# Patient Record
Sex: Female | Born: 1945 | Race: White | Hispanic: No | Marital: Married | Smoking: Former smoker
Health system: Southern US, Community
[De-identification: ages and names within clinical notes are randomized; demographics above are authoritative.]

## PROBLEM LIST (undated history)

## (undated) DIAGNOSIS — F0390 Unspecified dementia without behavioral disturbance: Secondary | ICD-10-CM

---

## 2007-05-31 ENCOUNTER — Ambulatory Visit: Payer: Self-pay | Admitting: Gastroenterology

## 2007-08-01 ENCOUNTER — Ambulatory Visit: Payer: Self-pay | Admitting: Family Medicine

## 2007-08-31 ENCOUNTER — Ambulatory Visit: Payer: Self-pay | Admitting: Internal Medicine

## 2007-09-05 ENCOUNTER — Ambulatory Visit: Payer: Self-pay | Admitting: Unknown Physician Specialty

## 2009-10-28 ENCOUNTER — Ambulatory Visit: Payer: Self-pay | Admitting: Family Medicine

## 2011-03-04 ENCOUNTER — Ambulatory Visit: Payer: Self-pay | Admitting: Family Medicine

## 2013-04-10 ENCOUNTER — Ambulatory Visit: Payer: Self-pay | Admitting: Family Medicine

## 2015-01-24 DIAGNOSIS — H2513 Age-related nuclear cataract, bilateral: Secondary | ICD-10-CM | POA: Diagnosis not present

## 2015-09-24 ENCOUNTER — Other Ambulatory Visit: Payer: Self-pay | Admitting: Family Medicine

## 2015-09-24 DIAGNOSIS — E785 Hyperlipidemia, unspecified: Secondary | ICD-10-CM | POA: Diagnosis not present

## 2015-09-24 DIAGNOSIS — N390 Urinary tract infection, site not specified: Secondary | ICD-10-CM | POA: Diagnosis not present

## 2015-09-24 DIAGNOSIS — M8580 Other specified disorders of bone density and structure, unspecified site: Secondary | ICD-10-CM | POA: Diagnosis not present

## 2015-09-24 DIAGNOSIS — Z0000 Encounter for general adult medical examination without abnormal findings: Secondary | ICD-10-CM | POA: Diagnosis not present

## 2015-09-24 DIAGNOSIS — Z1231 Encounter for screening mammogram for malignant neoplasm of breast: Secondary | ICD-10-CM

## 2015-10-06 ENCOUNTER — Ambulatory Visit: Payer: Self-pay

## 2015-10-09 ENCOUNTER — Ambulatory Visit
Admission: RE | Admit: 2015-10-09 | Discharge: 2015-10-09 | Disposition: A | Discharge status: Home / Self Care | Payer: Commercial Managed Care - HMO | Source: Ambulatory Visit | Attending: Family Medicine | Admitting: Family Medicine

## 2015-10-09 ENCOUNTER — Other Ambulatory Visit: Payer: Self-pay | Admitting: Family Medicine

## 2015-10-09 DIAGNOSIS — Z1231 Encounter for screening mammogram for malignant neoplasm of breast: Secondary | ICD-10-CM

## 2015-10-09 DIAGNOSIS — M8580 Other specified disorders of bone density and structure, unspecified site: Secondary | ICD-10-CM | POA: Diagnosis not present

## 2015-10-10 ENCOUNTER — Ambulatory Visit: Payer: Self-pay

## 2016-04-02 DIAGNOSIS — L732 Hidradenitis suppurativa: Secondary | ICD-10-CM | POA: Diagnosis not present

## 2016-04-02 DIAGNOSIS — N61 Mastitis without abscess: Secondary | ICD-10-CM | POA: Diagnosis not present

## 2016-05-11 DIAGNOSIS — J069 Acute upper respiratory infection, unspecified: Secondary | ICD-10-CM | POA: Diagnosis not present

## 2016-05-11 DIAGNOSIS — R21 Rash and other nonspecific skin eruption: Secondary | ICD-10-CM | POA: Diagnosis not present

## 2016-05-11 DIAGNOSIS — B9789 Other viral agents as the cause of diseases classified elsewhere: Secondary | ICD-10-CM | POA: Diagnosis not present

## 2016-05-11 DIAGNOSIS — N61 Mastitis without abscess: Secondary | ICD-10-CM | POA: Diagnosis not present

## 2016-07-27 DIAGNOSIS — L02215 Cutaneous abscess of perineum: Secondary | ICD-10-CM | POA: Diagnosis not present

## 2016-07-29 DIAGNOSIS — L02215 Cutaneous abscess of perineum: Secondary | ICD-10-CM | POA: Diagnosis not present

## 2016-09-28 DIAGNOSIS — Z1211 Encounter for screening for malignant neoplasm of colon: Secondary | ICD-10-CM | POA: Diagnosis not present

## 2016-09-28 DIAGNOSIS — E785 Hyperlipidemia, unspecified: Secondary | ICD-10-CM | POA: Diagnosis not present

## 2016-09-28 DIAGNOSIS — Z0000 Encounter for general adult medical examination without abnormal findings: Secondary | ICD-10-CM | POA: Diagnosis not present

## 2016-09-28 DIAGNOSIS — D649 Anemia, unspecified: Secondary | ICD-10-CM | POA: Diagnosis not present

## 2016-09-28 DIAGNOSIS — L259 Unspecified contact dermatitis, unspecified cause: Secondary | ICD-10-CM | POA: Diagnosis not present

## 2016-09-28 DIAGNOSIS — Z23 Encounter for immunization: Secondary | ICD-10-CM | POA: Diagnosis not present

## 2016-12-10 DIAGNOSIS — R21 Rash and other nonspecific skin eruption: Secondary | ICD-10-CM | POA: Diagnosis not present

## 2017-06-16 ENCOUNTER — Ambulatory Visit
Admission: RE | Admit: 2017-06-16 | Discharge: 2017-06-16 | Disposition: A | Discharge status: Home / Self Care | Payer: Medicare HMO | Source: Ambulatory Visit | Attending: Family Medicine | Admitting: Family Medicine

## 2017-06-16 ENCOUNTER — Other Ambulatory Visit: Payer: Self-pay | Admitting: Family Medicine

## 2017-06-16 DIAGNOSIS — M79604 Pain in right leg: Secondary | ICD-10-CM

## 2017-06-16 DIAGNOSIS — R600 Localized edema: Secondary | ICD-10-CM | POA: Diagnosis not present

## 2017-06-16 DIAGNOSIS — M79661 Pain in right lower leg: Secondary | ICD-10-CM | POA: Diagnosis not present

## 2017-06-16 DIAGNOSIS — R413 Other amnesia: Secondary | ICD-10-CM | POA: Diagnosis not present

## 2019-02-08 DIAGNOSIS — L255 Unspecified contact dermatitis due to plants, except food: Secondary | ICD-10-CM | POA: Diagnosis not present

## 2019-09-05 DIAGNOSIS — E785 Hyperlipidemia, unspecified: Secondary | ICD-10-CM | POA: Diagnosis not present

## 2019-09-05 DIAGNOSIS — Z0000 Encounter for general adult medical examination without abnormal findings: Secondary | ICD-10-CM | POA: Diagnosis not present

## 2019-09-05 DIAGNOSIS — Z87891 Personal history of nicotine dependence: Secondary | ICD-10-CM | POA: Diagnosis not present

## 2019-09-07 DIAGNOSIS — Z0000 Encounter for general adult medical examination without abnormal findings: Secondary | ICD-10-CM | POA: Diagnosis not present

## 2019-09-07 DIAGNOSIS — R413 Other amnesia: Secondary | ICD-10-CM | POA: Diagnosis not present

## 2019-09-07 DIAGNOSIS — E785 Hyperlipidemia, unspecified: Secondary | ICD-10-CM | POA: Diagnosis not present

## 2019-09-10 ENCOUNTER — Other Ambulatory Visit: Payer: Self-pay | Admitting: Family Medicine

## 2019-09-10 DIAGNOSIS — Z1231 Encounter for screening mammogram for malignant neoplasm of breast: Secondary | ICD-10-CM

## 2020-02-11 DIAGNOSIS — Z20822 Contact with and (suspected) exposure to covid-19: Secondary | ICD-10-CM | POA: Diagnosis not present

## 2020-02-11 DIAGNOSIS — Z03818 Encounter for observation for suspected exposure to other biological agents ruled out: Secondary | ICD-10-CM | POA: Diagnosis not present

## 2020-02-11 DIAGNOSIS — U071 COVID-19: Secondary | ICD-10-CM | POA: Diagnosis not present

## 2020-02-14 DIAGNOSIS — U071 COVID-19: Secondary | ICD-10-CM | POA: Diagnosis not present

## 2020-02-14 DIAGNOSIS — E876 Hypokalemia: Secondary | ICD-10-CM | POA: Diagnosis not present

## 2020-02-14 DIAGNOSIS — R05 Cough: Secondary | ICD-10-CM | POA: Diagnosis not present

## 2020-02-14 DIAGNOSIS — J9601 Acute respiratory failure with hypoxia: Secondary | ICD-10-CM | POA: Diagnosis not present

## 2020-02-14 DIAGNOSIS — Z87891 Personal history of nicotine dependence: Secondary | ICD-10-CM | POA: Diagnosis not present

## 2020-02-14 DIAGNOSIS — R0602 Shortness of breath: Secondary | ICD-10-CM | POA: Diagnosis not present

## 2020-02-14 DIAGNOSIS — F0390 Unspecified dementia without behavioral disturbance: Secondary | ICD-10-CM | POA: Diagnosis not present

## 2020-02-14 DIAGNOSIS — J1282 Pneumonia due to coronavirus disease 2019: Secondary | ICD-10-CM | POA: Diagnosis not present

## 2020-02-14 DIAGNOSIS — R918 Other nonspecific abnormal finding of lung field: Secondary | ICD-10-CM | POA: Diagnosis not present

## 2020-02-14 DIAGNOSIS — R0902 Hypoxemia: Secondary | ICD-10-CM | POA: Diagnosis not present

## 2020-02-14 DIAGNOSIS — D6859 Other primary thrombophilia: Secondary | ICD-10-CM | POA: Diagnosis not present

## 2020-02-22 DIAGNOSIS — Z87891 Personal history of nicotine dependence: Secondary | ICD-10-CM | POA: Diagnosis not present

## 2020-02-22 DIAGNOSIS — G4700 Insomnia, unspecified: Secondary | ICD-10-CM | POA: Diagnosis not present

## 2020-02-22 DIAGNOSIS — J9601 Acute respiratory failure with hypoxia: Secondary | ICD-10-CM | POA: Diagnosis not present

## 2020-02-22 DIAGNOSIS — E8809 Other disorders of plasma-protein metabolism, not elsewhere classified: Secondary | ICD-10-CM | POA: Diagnosis not present

## 2020-02-22 DIAGNOSIS — U071 COVID-19: Secondary | ICD-10-CM | POA: Diagnosis not present

## 2020-02-22 DIAGNOSIS — J1282 Pneumonia due to coronavirus disease 2019: Secondary | ICD-10-CM | POA: Diagnosis not present

## 2020-02-22 DIAGNOSIS — E876 Hypokalemia: Secondary | ICD-10-CM | POA: Diagnosis not present

## 2020-02-22 DIAGNOSIS — F0390 Unspecified dementia without behavioral disturbance: Secondary | ICD-10-CM | POA: Diagnosis not present

## 2020-02-22 DIAGNOSIS — Z9981 Dependence on supplemental oxygen: Secondary | ICD-10-CM | POA: Diagnosis not present

## 2020-02-22 DIAGNOSIS — Z9181 History of falling: Secondary | ICD-10-CM | POA: Diagnosis not present

## 2020-02-23 DIAGNOSIS — J9601 Acute respiratory failure with hypoxia: Secondary | ICD-10-CM | POA: Diagnosis not present

## 2020-02-23 DIAGNOSIS — Z9981 Dependence on supplemental oxygen: Secondary | ICD-10-CM | POA: Diagnosis not present

## 2020-02-23 DIAGNOSIS — E876 Hypokalemia: Secondary | ICD-10-CM | POA: Diagnosis not present

## 2020-02-23 DIAGNOSIS — Z87891 Personal history of nicotine dependence: Secondary | ICD-10-CM | POA: Diagnosis not present

## 2020-02-23 DIAGNOSIS — F0390 Unspecified dementia without behavioral disturbance: Secondary | ICD-10-CM | POA: Diagnosis not present

## 2020-02-23 DIAGNOSIS — E8809 Other disorders of plasma-protein metabolism, not elsewhere classified: Secondary | ICD-10-CM | POA: Diagnosis not present

## 2020-02-23 DIAGNOSIS — Z9181 History of falling: Secondary | ICD-10-CM | POA: Diagnosis not present

## 2020-02-23 DIAGNOSIS — U071 COVID-19: Secondary | ICD-10-CM | POA: Diagnosis not present

## 2020-02-23 DIAGNOSIS — J1282 Pneumonia due to coronavirus disease 2019: Secondary | ICD-10-CM | POA: Diagnosis not present

## 2020-02-25 DIAGNOSIS — Z9981 Dependence on supplemental oxygen: Secondary | ICD-10-CM | POA: Diagnosis not present

## 2020-02-25 DIAGNOSIS — U071 COVID-19: Secondary | ICD-10-CM | POA: Diagnosis not present

## 2020-02-25 DIAGNOSIS — J1282 Pneumonia due to coronavirus disease 2019: Secondary | ICD-10-CM | POA: Diagnosis not present

## 2020-02-25 DIAGNOSIS — Z9181 History of falling: Secondary | ICD-10-CM | POA: Diagnosis not present

## 2020-02-25 DIAGNOSIS — F0390 Unspecified dementia without behavioral disturbance: Secondary | ICD-10-CM | POA: Diagnosis not present

## 2020-02-25 DIAGNOSIS — E876 Hypokalemia: Secondary | ICD-10-CM | POA: Diagnosis not present

## 2020-02-25 DIAGNOSIS — Z87891 Personal history of nicotine dependence: Secondary | ICD-10-CM | POA: Diagnosis not present

## 2020-02-25 DIAGNOSIS — E8809 Other disorders of plasma-protein metabolism, not elsewhere classified: Secondary | ICD-10-CM | POA: Diagnosis not present

## 2020-02-25 DIAGNOSIS — J9601 Acute respiratory failure with hypoxia: Secondary | ICD-10-CM | POA: Diagnosis not present

## 2020-02-27 DIAGNOSIS — U071 COVID-19: Secondary | ICD-10-CM | POA: Diagnosis not present

## 2020-02-27 DIAGNOSIS — J1282 Pneumonia due to coronavirus disease 2019: Secondary | ICD-10-CM | POA: Diagnosis not present

## 2020-02-27 DIAGNOSIS — Z87891 Personal history of nicotine dependence: Secondary | ICD-10-CM | POA: Diagnosis not present

## 2020-02-27 DIAGNOSIS — J9601 Acute respiratory failure with hypoxia: Secondary | ICD-10-CM | POA: Diagnosis not present

## 2020-02-27 DIAGNOSIS — Z9181 History of falling: Secondary | ICD-10-CM | POA: Diagnosis not present

## 2020-02-27 DIAGNOSIS — E876 Hypokalemia: Secondary | ICD-10-CM | POA: Diagnosis not present

## 2020-02-27 DIAGNOSIS — Z9981 Dependence on supplemental oxygen: Secondary | ICD-10-CM | POA: Diagnosis not present

## 2020-02-27 DIAGNOSIS — F0390 Unspecified dementia without behavioral disturbance: Secondary | ICD-10-CM | POA: Diagnosis not present

## 2020-02-27 DIAGNOSIS — E8809 Other disorders of plasma-protein metabolism, not elsewhere classified: Secondary | ICD-10-CM | POA: Diagnosis not present

## 2020-02-28 DIAGNOSIS — Z87891 Personal history of nicotine dependence: Secondary | ICD-10-CM | POA: Diagnosis not present

## 2020-02-28 DIAGNOSIS — F0390 Unspecified dementia without behavioral disturbance: Secondary | ICD-10-CM | POA: Diagnosis not present

## 2020-02-28 DIAGNOSIS — E8809 Other disorders of plasma-protein metabolism, not elsewhere classified: Secondary | ICD-10-CM | POA: Diagnosis not present

## 2020-02-28 DIAGNOSIS — Z9181 History of falling: Secondary | ICD-10-CM | POA: Diagnosis not present

## 2020-02-28 DIAGNOSIS — Z9981 Dependence on supplemental oxygen: Secondary | ICD-10-CM | POA: Diagnosis not present

## 2020-02-28 DIAGNOSIS — J1282 Pneumonia due to coronavirus disease 2019: Secondary | ICD-10-CM | POA: Diagnosis not present

## 2020-02-28 DIAGNOSIS — U071 COVID-19: Secondary | ICD-10-CM | POA: Diagnosis not present

## 2020-02-28 DIAGNOSIS — J9601 Acute respiratory failure with hypoxia: Secondary | ICD-10-CM | POA: Diagnosis not present

## 2020-02-28 DIAGNOSIS — E876 Hypokalemia: Secondary | ICD-10-CM | POA: Diagnosis not present

## 2020-02-29 DIAGNOSIS — J9601 Acute respiratory failure with hypoxia: Secondary | ICD-10-CM | POA: Diagnosis not present

## 2020-02-29 DIAGNOSIS — E8809 Other disorders of plasma-protein metabolism, not elsewhere classified: Secondary | ICD-10-CM | POA: Diagnosis not present

## 2020-02-29 DIAGNOSIS — Z87891 Personal history of nicotine dependence: Secondary | ICD-10-CM | POA: Diagnosis not present

## 2020-02-29 DIAGNOSIS — U071 COVID-19: Secondary | ICD-10-CM | POA: Diagnosis not present

## 2020-02-29 DIAGNOSIS — F0390 Unspecified dementia without behavioral disturbance: Secondary | ICD-10-CM | POA: Diagnosis not present

## 2020-02-29 DIAGNOSIS — Z9181 History of falling: Secondary | ICD-10-CM | POA: Diagnosis not present

## 2020-02-29 DIAGNOSIS — J1282 Pneumonia due to coronavirus disease 2019: Secondary | ICD-10-CM | POA: Diagnosis not present

## 2020-02-29 DIAGNOSIS — Z9981 Dependence on supplemental oxygen: Secondary | ICD-10-CM | POA: Diagnosis not present

## 2020-02-29 DIAGNOSIS — E876 Hypokalemia: Secondary | ICD-10-CM | POA: Diagnosis not present

## 2020-03-03 DIAGNOSIS — G4700 Insomnia, unspecified: Secondary | ICD-10-CM | POA: Diagnosis not present

## 2020-03-03 DIAGNOSIS — U071 COVID-19: Secondary | ICD-10-CM | POA: Diagnosis not present

## 2020-03-03 DIAGNOSIS — R413 Other amnesia: Secondary | ICD-10-CM | POA: Diagnosis not present

## 2020-03-05 DIAGNOSIS — Z9981 Dependence on supplemental oxygen: Secondary | ICD-10-CM | POA: Diagnosis not present

## 2020-03-05 DIAGNOSIS — F0390 Unspecified dementia without behavioral disturbance: Secondary | ICD-10-CM | POA: Diagnosis not present

## 2020-03-05 DIAGNOSIS — J9601 Acute respiratory failure with hypoxia: Secondary | ICD-10-CM | POA: Diagnosis not present

## 2020-03-05 DIAGNOSIS — Z9181 History of falling: Secondary | ICD-10-CM | POA: Diagnosis not present

## 2020-03-05 DIAGNOSIS — U071 COVID-19: Secondary | ICD-10-CM | POA: Diagnosis not present

## 2020-03-05 DIAGNOSIS — E8809 Other disorders of plasma-protein metabolism, not elsewhere classified: Secondary | ICD-10-CM | POA: Diagnosis not present

## 2020-03-05 DIAGNOSIS — J1282 Pneumonia due to coronavirus disease 2019: Secondary | ICD-10-CM | POA: Diagnosis not present

## 2020-03-05 DIAGNOSIS — E876 Hypokalemia: Secondary | ICD-10-CM | POA: Diagnosis not present

## 2020-03-05 DIAGNOSIS — Z87891 Personal history of nicotine dependence: Secondary | ICD-10-CM | POA: Diagnosis not present

## 2020-03-11 DIAGNOSIS — Z87891 Personal history of nicotine dependence: Secondary | ICD-10-CM | POA: Diagnosis not present

## 2020-03-11 DIAGNOSIS — Z9981 Dependence on supplemental oxygen: Secondary | ICD-10-CM | POA: Diagnosis not present

## 2020-03-11 DIAGNOSIS — Z9181 History of falling: Secondary | ICD-10-CM | POA: Diagnosis not present

## 2020-03-11 DIAGNOSIS — E876 Hypokalemia: Secondary | ICD-10-CM | POA: Diagnosis not present

## 2020-03-11 DIAGNOSIS — J1282 Pneumonia due to coronavirus disease 2019: Secondary | ICD-10-CM | POA: Diagnosis not present

## 2020-03-11 DIAGNOSIS — F0390 Unspecified dementia without behavioral disturbance: Secondary | ICD-10-CM | POA: Diagnosis not present

## 2020-03-11 DIAGNOSIS — E8809 Other disorders of plasma-protein metabolism, not elsewhere classified: Secondary | ICD-10-CM | POA: Diagnosis not present

## 2020-03-11 DIAGNOSIS — U071 COVID-19: Secondary | ICD-10-CM | POA: Diagnosis not present

## 2020-03-11 DIAGNOSIS — J9601 Acute respiratory failure with hypoxia: Secondary | ICD-10-CM | POA: Diagnosis not present

## 2020-03-13 DIAGNOSIS — Z9181 History of falling: Secondary | ICD-10-CM | POA: Diagnosis not present

## 2020-03-13 DIAGNOSIS — E876 Hypokalemia: Secondary | ICD-10-CM | POA: Diagnosis not present

## 2020-03-13 DIAGNOSIS — E8809 Other disorders of plasma-protein metabolism, not elsewhere classified: Secondary | ICD-10-CM | POA: Diagnosis not present

## 2020-03-13 DIAGNOSIS — Z87891 Personal history of nicotine dependence: Secondary | ICD-10-CM | POA: Diagnosis not present

## 2020-03-13 DIAGNOSIS — U071 COVID-19: Secondary | ICD-10-CM | POA: Diagnosis not present

## 2020-03-13 DIAGNOSIS — J1282 Pneumonia due to coronavirus disease 2019: Secondary | ICD-10-CM | POA: Diagnosis not present

## 2020-03-13 DIAGNOSIS — Z9981 Dependence on supplemental oxygen: Secondary | ICD-10-CM | POA: Diagnosis not present

## 2020-03-13 DIAGNOSIS — F0390 Unspecified dementia without behavioral disturbance: Secondary | ICD-10-CM | POA: Diagnosis not present

## 2020-03-13 DIAGNOSIS — J9601 Acute respiratory failure with hypoxia: Secondary | ICD-10-CM | POA: Diagnosis not present

## 2020-03-14 DIAGNOSIS — U071 COVID-19: Secondary | ICD-10-CM | POA: Diagnosis not present

## 2020-03-14 DIAGNOSIS — J9601 Acute respiratory failure with hypoxia: Secondary | ICD-10-CM | POA: Diagnosis not present

## 2020-04-03 DIAGNOSIS — G8929 Other chronic pain: Secondary | ICD-10-CM | POA: Diagnosis not present

## 2020-04-03 DIAGNOSIS — M25561 Pain in right knee: Secondary | ICD-10-CM | POA: Diagnosis not present

## 2020-04-30 ENCOUNTER — Other Ambulatory Visit: Payer: Self-pay | Admitting: Neurology

## 2020-04-30 DIAGNOSIS — F0150 Vascular dementia without behavioral disturbance: Secondary | ICD-10-CM

## 2020-05-14 ENCOUNTER — Ambulatory Visit
Admission: RE | Admit: 2020-05-14 | Discharge: 2020-05-14 | Disposition: A | Discharge status: Home / Self Care | Payer: Medicare HMO | Source: Ambulatory Visit | Attending: Neurology | Admitting: Neurology

## 2020-05-14 ENCOUNTER — Other Ambulatory Visit: Payer: Self-pay

## 2020-05-14 DIAGNOSIS — F0280 Dementia in other diseases classified elsewhere without behavioral disturbance: Secondary | ICD-10-CM | POA: Diagnosis present

## 2020-05-14 DIAGNOSIS — F0150 Vascular dementia without behavioral disturbance: Secondary | ICD-10-CM | POA: Insufficient documentation

## 2020-05-14 DIAGNOSIS — G309 Alzheimer's disease, unspecified: Secondary | ICD-10-CM | POA: Diagnosis present

## 2020-05-14 MED ORDER — GADOBUTROL 1 MMOL/ML IV SOLN
7.0000 mL | Freq: Once | INTRAVENOUS | Status: AC | PRN
  Administered 2020-05-14: 7 mL via INTRAVENOUS

## 2020-09-17 ENCOUNTER — Other Ambulatory Visit: Payer: Self-pay | Admitting: Family Medicine

## 2020-09-17 DIAGNOSIS — Z1231 Encounter for screening mammogram for malignant neoplasm of breast: Secondary | ICD-10-CM

## 2020-10-10 ENCOUNTER — Other Ambulatory Visit: Payer: Self-pay

## 2020-10-10 ENCOUNTER — Ambulatory Visit
Admission: RE | Admit: 2020-10-10 | Discharge: 2020-10-10 | Disposition: A | Discharge status: Home / Self Care | Payer: Medicare HMO | Source: Ambulatory Visit | Attending: Family Medicine | Admitting: Family Medicine

## 2020-10-10 DIAGNOSIS — Z1231 Encounter for screening mammogram for malignant neoplasm of breast: Secondary | ICD-10-CM

## 2021-12-11 ENCOUNTER — Ambulatory Visit
Admission: EM | Admit: 2021-12-11 | Discharge: 2021-12-11 | Disposition: A | Discharge status: Home / Self Care | Payer: Medicare HMO

## 2021-12-11 ENCOUNTER — Encounter: Payer: Self-pay | Admitting: Emergency Medicine

## 2021-12-11 DIAGNOSIS — S91012A Laceration without foreign body, left ankle, initial encounter: Secondary | ICD-10-CM

## 2021-12-11 DIAGNOSIS — Z23 Encounter for immunization: Secondary | ICD-10-CM

## 2021-12-11 HISTORY — DX: Unspecified dementia, unspecified severity, without behavioral disturbance, psychotic disturbance, mood disturbance, and anxiety: F03.90

## 2021-12-11 MED ORDER — CEPHALEXIN 500 MG PO CAPS: 500.0000 mg | ORAL_CAPSULE | Freq: Two times a day (BID) | ORAL | 0 refills | Status: AC

## 2021-12-11 MED ORDER — TETANUS-DIPHTHERIA TOXOIDS TD 5-2 LFU IM INJ
0.5000 mL | INJECTION | Freq: Once | INTRAMUSCULAR | Status: AC
  Administered 2021-12-11: 0.5 mL via INTRAMUSCULAR

## 2021-12-11 NOTE — Discharge Instructions (Addendum)
Leave the dressing in place for the next 24 hours.  After 24 hours remove the dressing, wash the wound with warm water and soap, pat it dry, and apply a thin smear of bacitracin.  Clean the wound daily and apply bacitracin for the first 2 days.  After that a scab should have started to form and you can leave the wound open to air when you are at home and cover with a Band-Aid when you go out in public.  Take the Keflex twice daily for 7 days for prevention of wound infection.  Sutures remain in place for 10 days, please return here or see your primary care provider for removal.  If you develop any redness at the wound site, swelling, pain, drainage, red streaks going up your hand, or fever please return for reevaluation.

## 2021-12-11 NOTE — ED Provider Notes (Signed)
[facility]    CSN: [id number] Arrival date & time: 12/11/21  1411      History   Chief Complaint Chief Complaint  Patient presents with   Laceration   Puncture Wound    Left ankle     HPI Rachel Dominguez is a 76 y.o. female.   HPI  76 year old female here for evaluation of laceration.  Patient is here with her daughter for evaluation of a laceration that she sustained last night around 6 PM on the inner aspect of her left ankle near her Achilles.  She has a 2.5 cm triangular laceration with some venous oozing.  She denies any numbness or tingling.  She is unsure of when her last tetanus shot was.  Past Medical History:  Diagnosis Date   Dementia ([place])     There are no problems to display for this patient.   History reviewed. No pertinent surgical history.  OB History   No obstetric history on file.      Home Medications    Prior to Admission medications   Medication Sig Start Date End Date Taking? Authorizing Provider  cephALEXin (KEFLEX) 500 MG capsule Take 1 capsule (500 mg total) by mouth 2 (two) times daily for 7 days. 12/11/21 12/18/21 Yes Margarette Canada, NP  donepezil (ARICEPT) 10 MG tablet TAKE 1 TABLET BY MOUTH EVERY DAY AT NIGHT 03/02/21  Yes [provider]  meloxicam (MOBIC) 7.5 MG tablet Take 7.5 mg by mouth daily as needed. 10/15/21  Yes [provider]  traZODone (DESYREL) 150 MG tablet Take by mouth. 10/07/21  Yes [provider]    Family History Family History  Problem Relation Age of Onset   Breast cancer Neg Hx     Social History Social History   Tobacco Use   Smoking status: Former    Types: Cigarettes   Smokeless tobacco: Former  Scientific laboratory technician Use: Never used  Substance Use Topics   Alcohol use: Not Currently   Drug use: Never     Allergies   Patient has no known allergies.   Review of Systems Review of Systems  Skin:  Positive for color change and wound.  Neurological:  Negative  for weakness.  Hematological: Negative.   Psychiatric/Behavioral: Negative.       Physical Exam Triage Vital Signs ED Triage Vitals  Enc Vitals Group     BP 12/11/21 1443 (!) 178/64     Pulse Rate 12/11/21 1443 76     Resp 12/11/21 1443 14     Temp 12/11/21 1443 98.2 F (36.8 C)     Temp Source 12/11/21 1443 Oral     SpO2 12/11/21 1443 95 %     Weight 12/11/21 1440 149 lb 0.5 oz (67.6 kg)     Height 12/11/21 1440 5\' 5"  (1.651 m)     Head Circumference --      Peak Flow --      Pain Score 12/11/21 1439 0     Pain Loc --      Pain Edu? --      Excl. in [place]? --    No data found.  Updated Vital Signs BP (!) 178/64 (BP Location: Right Arm)   Pulse 76   Temp 98.2 F (36.8 C) (Oral)   Resp 14   Ht 5\' 5"  (1.651 m)   Wt 149 lb 0.5 oz (67.6 kg)   SpO2 95%   BMI 24.80 kg/m   Visual Acuity Right  Eye Distance:   Left Eye Distance:   Bilateral Distance:    Right Eye Near:   Left Eye Near:    Bilateral Near:     Physical Exam Vitals and nursing note reviewed.  Constitutional:      Appearance: Normal appearance. She is not ill-appearing.  HENT:     Head: Normocephalic and atraumatic.  Musculoskeletal:        General: Signs of injury present.  Skin:    General: Skin is warm and dry.     Capillary Refill: Capillary refill takes less than 2 seconds.     Findings: Bruising present.  Neurological:     General: No focal deficit present.     Mental Status: She is alert and oriented to person, place, and time.  Psychiatric:        Mood and Affect: Mood normal.        Behavior: Behavior normal.        Thought Content: Thought content normal.        Judgment: Judgment normal.      UC Treatments / Results  Labs (all labs ordered are listed, but only abnormal results are displayed) Labs Reviewed - No data to display  EKG   Radiology No results found.  Procedures Laceration Repair  Date/Time: 12/11/2021 3:17 PM  Performed by: Becky Augusta, NP Authorized by:  Becky Augusta, NP   Consent:    Consent obtained:  Verbal   Consent given by:  Patient   Risks discussed:  Infection, pain, retained foreign body, poor cosmetic result and poor wound healing   Alternatives discussed:  Referral Universal protocol:    Patient identity confirmed:  Verbally with patient Anesthesia:    Anesthesia method:  Local infiltration   Local anesthetic:  Lidocaine 1% w/o epi (3 mL) Laceration details:    Location:  Foot   Foot location:  L ankle   Length (cm):  2.5   Depth (mm):  6 Pre-procedure details:    Preparation:  Patient was prepped and draped in usual sterile fashion Exploration:    Limited defect created (wound extended): no     Hemostasis achieved with:  Direct pressure   Wound exploration: wound explored through full range of motion and entire depth of wound visualized     Wound extent: no areolar tissue violation noted, no fascia violation noted, no foreign bodies/material noted, no muscle damage noted, no nerve damage noted and no tendon damage noted     Contaminated: no   Treatment:    Area cleansed with:  Chlorhexidine and saline   Amount of cleaning:  Standard   Irrigation solution:  Sterile saline   Irrigation volume:  120   Irrigation method:  Pressure wash and syringe   Debridement:  None Skin repair:    Repair method:  Sutures   Suture size:  4-0   Suture material:  Prolene   Suture technique:  Simple interrupted   Number of sutures:  5 Approximation:    Approximation:  Close Repair type:    Repair type:  Simple Post-procedure details:    Dressing:  Non-adherent dressing   Procedure completion:  Tolerated well, no immediate complications  (including critical care time)  Medications Ordered in UC Medications  tetanus & diphtheria toxoids (adult) (TENIVAC) injection 0.5 mL (0.5 mLs Intramuscular Given 12/11/21 1453)    Initial Impression / Assessment and Plan / UC Course  I have reviewed the triage vital signs and the nursing  notes.  Pertinent labs &  imaging results that were available during my care of the patient were reviewed by me and considered in my medical decision making (see chart for details).  Patient very pleasant, nontoxic-appearing 62 old female here for evaluation of a laceration that is on the medial and posterior aspect of her left ankle.  The laceration was sustained when her foot got caught on the storm door when going into her house last night around 6 PM.  She had a dressing in place and her daughter brought her in for evaluation.  She is still having some venous oozing.  The laceration itself is triangular in appearance and each leg of the triangle measures approximately 2.5 cm.  There is some faint venous oozing from the wound.  I will suture the skin flap in place and discharge patient home on Keflex twice daily x7 days.  I will also have her tetanus shot updated.  Patient's wound was closed using 5 simple erupted sutures of 4-0 Prolene.  Patient tolerated procedure well.  Discharged home on Keflex.  Return precautions reviewed.   Final Clinical Impressions(s) / UC Diagnoses   Final diagnoses:  Laceration of left ankle, initial encounter     Discharge Instructions      Leave the dressing in place for the next 24 hours.  After 24 hours remove the dressing, wash the wound with warm water and soap, pat it dry, and apply a thin smear of bacitracin.  Clean the wound daily and apply bacitracin for the first 2 days.  After that a scab should have started to form and you can leave the wound open to air when you are at home and cover with a Band-Aid when you go out in public.  Take the Keflex twice daily for 7 days for prevention of wound infection.  Sutures remain in place for 10 days, please return here or see your primary care provider for removal.  If you develop any redness at the wound site, swelling, pain, drainage, red streaks going up your hand, or fever please return for reevaluation.       ED Prescriptions     Medication Sig Dispense Auth. Provider   cephALEXin (KEFLEX) 500 MG capsule Take 1 capsule (500 mg total) by mouth 2 (two) times daily for 7 days. 14 capsule Becky Augusta, NP      PDMP not reviewed this encounter.   Becky Augusta, NP 12/11/21 1520

## 2021-12-11 NOTE — ED Triage Notes (Signed)
Patient states that she cut her left ankle on the storm door around 6pm yesterday.  Patient has a deep laceration to her left ankle.  Patient has minimal bleeding at this time.

## 2021-12-21 ENCOUNTER — Ambulatory Visit
Admission: EM | Admit: 2021-12-21 | Discharge: 2021-12-21 | Disposition: A | Discharge status: Home / Self Care | Payer: Medicare HMO | Attending: Nurse Practitioner | Admitting: Nurse Practitioner

## 2021-12-21 ENCOUNTER — Encounter: Payer: Self-pay | Admitting: Emergency Medicine

## 2021-12-21 ENCOUNTER — Other Ambulatory Visit: Payer: Self-pay

## 2021-12-21 DIAGNOSIS — Z4802 Encounter for removal of sutures: Secondary | ICD-10-CM

## 2021-12-21 DIAGNOSIS — S91012D Laceration without foreign body, left ankle, subsequent encounter: Secondary | ICD-10-CM

## 2021-12-21 DIAGNOSIS — Z5189 Encounter for other specified aftercare: Secondary | ICD-10-CM

## 2021-12-21 MED ORDER — DOXYCYCLINE HYCLATE 100 MG PO CAPS: 100.0000 mg | ORAL_CAPSULE | Freq: Two times a day (BID) | ORAL | 0 refills | Status: AC

## 2021-12-21 NOTE — ED Triage Notes (Addendum)
Patient was seen 12/11/2021 for a laceration to left inner ankle.  Patient is here today for suture removal.  Crusty at edge of wound, redness around wound. Requested crystal, np  see patient

## 2021-12-21 NOTE — ED Provider Notes (Signed)
  [facility]    CSN: [id number] Arrival date & time: 12/21/21  1306      History   Chief Complaint No chief complaint on file.   HPI Rachel Dominguez is a 76 y.o. female.   HPI  Past Medical History:  Diagnosis Date  . Dementia ([facility])     There are no problems to display for this patient.   No past surgical history on file.  OB History   No obstetric history on file.      Home Medications    Prior to Admission medications   Medication Sig Start Date End Date Taking? Authorizing Provider  donepezil (ARICEPT) 10 MG tablet TAKE 1 TABLET BY MOUTH EVERY DAY AT NIGHT 03/02/21   [provider]  meloxicam (MOBIC) 7.5 MG tablet Take 7.5 mg by mouth daily as needed. 10/15/21   [provider]  traZODone (DESYREL) 150 MG tablet Take by mouth. 10/07/21   [provider]    Family History Family History  Problem Relation Age of Onset  . Breast cancer Neg Hx     Social History Social History   Tobacco Use  . Smoking status: Former    Types: Cigarettes  . Smokeless tobacco: Former  Advertising account planner  . Vaping Use: Never used  Substance Use Topics  . Alcohol use: Not Currently  . Drug use: Never     Allergies   Patient has no known allergies.   Review of Systems Review of Systems   Physical Exam Triage Vital Signs ED Triage Vitals  Enc Vitals Group     BP      Pulse      Resp      Temp      Temp src      SpO2      Weight      Height      Head Circumference      Peak Flow      Pain Score      Pain Loc      Pain Edu?      Excl. in [place]?    No data found.  Updated Vital Signs There were no vitals taken for this visit.  Visual Acuity Right Eye Distance:   Left Eye Distance:   Bilateral Distance:    Right Eye Near:   Left Eye Near:    Bilateral Near:     Physical Exam   UC Treatments / Results  Labs (all labs ordered are listed, but only abnormal results are displayed) Labs Reviewed - No data to  display  EKG   Radiology No results found.  Procedures Procedures (including critical care time)  Medications Ordered in UC Medications - No data to display  Initial Impression / Assessment and Plan / UC Course  I have reviewed the triage vital signs and the nursing notes.  Pertinent labs & imaging results that were available during my care of the patient were reviewed by me and considered in my medical decision making (see chart for details).     *** Final Clinical Impressions(s) / UC Diagnoses   Final diagnoses:  None   Discharge Instructions   None    ED Prescriptions   None    PDMP not reviewed this encounter.

## 2021-12-21 NOTE — Discharge Instructions (Addendum)
Doxycycline 100 mg  BID take with food Take as directed Please complete the entire prescription.  May use band-aid to cover at night May use the neosporin daily as needed

## 2021-12-28 ENCOUNTER — Ambulatory Visit
Admission: EM | Admit: 2021-12-28 | Discharge: 2021-12-28 | Disposition: A | Discharge status: Home / Self Care | Payer: Medicare HMO | Attending: Physician Assistant | Admitting: Physician Assistant

## 2021-12-28 ENCOUNTER — Inpatient Hospital Stay
Admission: RE | Admit: 2021-12-28 | Discharge: 2021-12-28 | Disposition: A | Discharge status: Home / Self Care | Payer: Self-pay | Source: Ambulatory Visit

## 2021-12-28 DIAGNOSIS — S91312D Laceration without foreign body, left foot, subsequent encounter: Secondary | ICD-10-CM

## 2021-12-28 DIAGNOSIS — L03116 Cellulitis of left lower limb: Secondary | ICD-10-CM | POA: Diagnosis not present

## 2021-12-28 DIAGNOSIS — Z4802 Encounter for removal of sutures: Secondary | ICD-10-CM

## 2021-12-28 DIAGNOSIS — T148XXD Other injury of unspecified body region, subsequent encounter: Secondary | ICD-10-CM

## 2021-12-28 MED ORDER — AMOXICILLIN-POT CLAVULANATE 875-125 MG PO TABS: 1.0000 | ORAL_TABLET | Freq: Two times a day (BID) | ORAL | 0 refills | Status: AC

## 2021-12-28 NOTE — ED Triage Notes (Signed)
Pt c/o suture removal.  Pt has 5 sutures along a Triangle shaped wound on the inner left ankle. Wound is tender to touch and still red around the area.

## 2021-12-28 NOTE — ED Provider Notes (Signed)
[facility]    CSN: [id number] Arrival date & time: 12/28/21  1258      History   Chief Complaint Chief Complaint  Patient presents with   Suture / Staple Removal    HPI Rachel Dominguez is a 76 y.o. female presenting for reevaluation for wound of the left inner ankle.  Patient was initially seen on 12/11/2021 and had suture repair with 5 simple sutures.  She was then seen on 12/21/2021 to have the sutures removed but was found to have an infection and was placed on more antibiotics.  Initially after the suture repair she was placed on Keflex and then at the second visit she was placed on doxycycline.  Sutures were not removed.  Patient returns today, 17 days after suture placement, for reevaluation.  She continues to have redness in this area and tenderness.  She says the redness is gone down a little since her last visit but not much.  No pustular drainage.  No fevers.  Patient has been cleaning in the shower with soap and water and apply Neosporin.  She denies any reinjury.  HPI  Past Medical History:  Diagnosis Date   Dementia ([facility])     There are no problems to display for this patient.   History reviewed. No pertinent surgical history.  OB History   No obstetric history on file.      Home Medications    Prior to Admission medications   Medication Sig Start Date End Date Taking? Authorizing Provider  amoxicillin-clavulanate (AUGMENTIN) 875-125 MG tablet Take 1 tablet by mouth every 12 (twelve) hours for 7 days. 12/28/21 01/04/22 Yes Eusebio Friendly B, PA-C  donepezil (ARICEPT) 10 MG tablet TAKE 1 TABLET BY MOUTH EVERY DAY AT NIGHT 03/02/21  Yes [provider]  meloxicam (MOBIC) 7.5 MG tablet Take 7.5 mg by mouth daily as needed. 10/15/21  Yes [provider]  traZODone (DESYREL) 150 MG tablet Take by mouth. 10/07/21  Yes [provider]    Family History Family History  Problem Relation Age of Onset   Breast cancer Neg Hx     Social  History Social History   Tobacco Use   Smoking status: Former    Types: Cigarettes   Smokeless tobacco: Former  Building services engineer Use: Never used  Substance Use Topics   Alcohol use: Not Currently   Drug use: Never     Allergies   Patient has no known allergies.   Review of Systems Review of Systems  Constitutional:  Negative for fever.  Musculoskeletal:  Negative for arthralgias, gait problem and joint swelling.  Skin:  Positive for color change and wound.  Neurological:  Negative for weakness and numbness.     Physical Exam Triage Vital Signs ED Triage Vitals  Enc Vitals Group     BP 12/28/21 1322 (!) 142/108     Pulse Rate 12/28/21 1322 61     Resp 12/28/21 1322 18     Temp 12/28/21 1322 97.8 F (36.6 C)     Temp Source 12/28/21 1322 Oral     SpO2 12/28/21 1322 100 %     Weight 12/28/21 1321 130 lb (59 kg)     Height 12/28/21 1321 5\' 5"  (1.651 m)     Head Circumference --      Peak Flow --      Pain Score 12/28/21 1320 6     Pain Loc --      Pain Edu? --  Excl. in [place]? --    No data found.  Updated Vital Signs BP (!) 142/108 (BP Location: Left Arm)   Pulse 61   Temp 97.8 F (36.6 C) (Oral)   Resp 18   Ht 5\' 5"  (1.651 m)   Wt 130 lb (59 kg)   SpO2 100%   BMI 21.63 kg/m   Physical Exam Vitals and nursing note reviewed.  Constitutional:      General: She is not in acute distress.    Appearance: Normal appearance. She is not ill-appearing or toxic-appearing.  HENT:     Head: Normocephalic and atraumatic.  Eyes:     General: No scleral icterus.       Right eye: No discharge.        Left eye: No discharge.     Conjunctiva/sclera: Conjunctivae normal.  Cardiovascular:     Rate and Rhythm: Normal rate and regular rhythm.     Pulses: Normal pulses.  Pulmonary:     Effort: Pulmonary effort is normal. No respiratory distress.  Musculoskeletal:     Cervical back: Neck supple.  Skin:    General: Skin is dry.     Comments: See image below.   Patient has triangle shape wound of the left medial ankle.  Surrounding erythema.  I did remove 5 sutures.  Appears to be somewhat poorly healing.  Good pulses.  It is tender to palpation.  Neurological:     General: No focal deficit present.     Mental Status: She is alert. Mental status is at baseline.     Motor: No weakness.     Gait: Gait normal.  Psychiatric:        Mood and Affect: Mood normal.        Behavior: Behavior normal.        Thought Content: Thought content normal.       UC Treatments / Results  Labs (all labs ordered are listed, but only abnormal results are displayed) Labs Reviewed - No data to display  EKG   Radiology No results found.  Procedures Procedures (including critical care time)  Medications Ordered in UC Medications - No data to display  Initial Impression / Assessment and Plan / UC Course  I have reviewed the triage vital signs and the nursing notes.  Pertinent labs & imaging results that were available during my care of the patient were reviewed by me and considered in my medical decision making (see chart for details).  54 old female presenting for evaluation of wound of the left medial ankle.  Patient has sutures placed 17 days ago.  She has been on antibiotics for the past 17 days.  Initially she was on Keflex and doxycycline.  Reports continued redness and pain in this area.  Today I cleaned the area with alcohol and removed 5 simple sutures.  Wound did mostly stay together but there is a little bit of gaping in the superior portion.  Surrounding erythema and swelling of the lower aspect of the wound.  Appears to be cellulitic and somewhat poorly healing.  I have placed a wound care referral for patient.  Have also sent in Augmentin for continued cellulitis treatment.  Discussed good wound care.  Advised to follow-up with wound care specialist when called, otherwise if symptoms worsen before that to go to ER.   Final Clinical Impressions(s)  / UC Diagnoses   Final diagnoses:  Cellulitis of left ankle  Delayed wound healing  Encounter for removal of sutures  Discharge Instructions      -I have removed the sutures. - This is healing very slowly so I placed a referral to wound care. - I have sent more antibiotics since it still red and swollen.  Appears to still be mildly infected. - Clean with soap and water and make sure to dry well. - Go to ER for any severe acute worsening of your symptoms.     ED Prescriptions     Medication Sig Dispense Auth. Provider   amoxicillin-clavulanate (AUGMENTIN) 875-125 MG tablet Take 1 tablet by mouth every 12 (twelve) hours for 7 days. 14 tablet Gareth Morgan      PDMP not reviewed this encounter.   Shirlee Latch, PA-C 12/28/21 1413

## 2021-12-28 NOTE — Discharge Instructions (Addendum)
-  I have removed the sutures. - This is healing very slowly so I placed a referral to wound care. - I have sent more antibiotics since it still red and swollen.  Appears to still be mildly infected. - Clean with soap and water and make sure to dry well. - Go to ER for any severe acute worsening of your symptoms.

## 2022-01-14 ENCOUNTER — Encounter: Payer: Medicare HMO | Attending: Physician Assistant | Admitting: Physician Assistant

## 2022-01-14 DIAGNOSIS — L97322 Non-pressure chronic ulcer of left ankle with fat layer exposed: Secondary | ICD-10-CM | POA: Insufficient documentation

## 2022-01-14 DIAGNOSIS — W2209XA Striking against other stationary object, initial encounter: Secondary | ICD-10-CM | POA: Insufficient documentation

## 2022-01-14 DIAGNOSIS — Z87891 Personal history of nicotine dependence: Secondary | ICD-10-CM | POA: Diagnosis not present

## 2022-01-14 DIAGNOSIS — F0390 Unspecified dementia without behavioral disturbance: Secondary | ICD-10-CM | POA: Diagnosis not present

## 2022-01-14 DIAGNOSIS — S91012A Laceration without foreign body, left ankle, initial encounter: Secondary | ICD-10-CM | POA: Diagnosis not present

## 2022-01-14 NOTE — Progress Notes (Signed)
Rachel Dominguez, Rachel Dominguez ([id number]) Visit Report for 01/14/2022 Abuse Risk Screen Details Patient Name: Rachel Dominguez, Rachel Dominguez. Date of Service: 01/14/2022 12:45 PM Medical Record Number: [id number] Patient Account Number: [id number] Date of Birth/Sex: 1946/03/21 (75 y.o. F) Treating RN: Angelina Pih Primary Care Lakeya Mulka: Jerl Mina Other Clinician: Betha Loa Referring Janequa Kipnis: Eusebio Friendly Treating Braylen Denunzio/Extender: Rowan Blase in Treatment: 0 Abuse Risk Screen Items Answer ABUSE RISK SCREEN: Has anyone close to you tried to hurt or harm you recentlyo No Do you feel uncomfortable with anyone in your familyo No Has anyone forced you do things that you didnot want to doo No Electronic Signature(s) Signed: 01/14/2022 4:10:56 PM By: Angelina Pih Entered By: Angelina Pih on 01/14/2022 12:57:16 [place], Pola Corn ([id number]) -------------------------------------------------------------------------------- Activities of Daily Living Details Patient Name: Rachel Dominguez, Rachel Dominguez. Date of Service: 01/14/2022 12:45 PM Medical Record Number: [id number] Patient Account Number: [id number] Date of Birth/Sex: 08/24/1945 (75 y.o. F) Treating RN: Angelina Pih Primary Care Corin Tilly: Jerl Mina Other Clinician: Betha Loa Referring Juliette Standre: Eusebio Friendly Treating Evelina Lore/Extender: Rowan Blase in Treatment: 0 Activities of Daily Living Items Answer Activities of Daily Living (Please select one for each item) Drive Automobile Need Assistance Take Medications Completely Able Use Telephone Completely Able Care for Appearance Completely Able Use Toilet Completely Able Bath / Shower Completely Able Dress Self Completely Able Feed Self Completely Able Walk Completely Able Get In / Out Bed Completely Able Housework Completely Able Prepare Meals Completely Able Handle Money Completely Able Shop for Self Completely Able Electronic Signature(s) Signed: 01/14/2022 4:10:56 PM By: Angelina Pih Entered By: Angelina Pih on 01/14/2022 12:57:48 Lagrand, Pola Corn ([id number]) -------------------------------------------------------------------------------- Education Screening Details Patient Name: Rachel Dominguez. Date of Service: 01/14/2022 12:45 PM Medical Record Number: [id number] Patient Account Number: [id number] Date of Birth/Sex: 1946-02-07 (75 y.o. F) Treating RN: Angelina Pih Primary Care Khala Tarte: Jerl Mina Other Clinician: Betha Loa Referring Starlynn Klinkner: Eusebio Friendly Treating Aerith Canal/Extender: Rowan Blase in Treatment: 0 Learning Preferences/Education Level/Primary Language Learning Preference: Explanation, Demonstration, Video, Communication Board, Printed Material Preferred Language: English Cognitive Barrier Language Barrier: No Translator Needed: No Memory Deficit: No Emotional Barrier: No Cultural/Religious Beliefs Affecting Medical Care: No Physical Barrier Impaired Vision: Yes Glasses Impaired Hearing: No Decreased Hand dexterity: No Knowledge/Comprehension Knowledge Level: Medium Comprehension Level: Medium Ability to understand written instructions: Medium Ability to understand verbal instructions: Medium Motivation Anxiety Level: Calm Cooperation: Cooperative Education Importance: Acknowledges Need Interest in Health Problems: Asks Questions Perception: Coherent Willingness to Engage in Self-Management High Activities: Readiness to Engage in Self-Management High Activities: Electronic Signature(s) Signed: 01/14/2022 4:10:56 PM By: Angelina Pih Entered By: Angelina Pih on 01/14/2022 12:58:07 Rachel Dominguez ([id number]) -------------------------------------------------------------------------------- Fall Risk Assessment Details Patient Name: Rachel Dominguez. Date of Service: 01/14/2022 12:45 PM Medical Record Number: [id number] Patient Account Number: [id number] Date of Birth/Sex: 12/16/1945 (75 y.o. F) Treating RN:  Angelina Pih Primary Care Rihana Kiddy: Jerl Mina Other Clinician: Betha Loa Referring Talayia Hjort: Eusebio Friendly Treating Talynn Lebon/Extender: Rowan Blase in Treatment: 0 Fall Risk Assessment Items Have you had 2 or more falls in the last 12 monthso 0 No Have you had any fall that resulted in injury in the last 12 monthso 0 No FALLS RISK SCREEN History of falling - immediate or within 3 months 0 No Secondary diagnosis (Do you have 2 or more medical diagnoseso) 0 No Ambulatory aid None/bed rest/wheelchair/nurse 0 Yes Crutches/cane/walker 0 No Furniture 0 No Intravenous therapy Access/Saline/Heparin Lock 0 No Gait/Transferring Normal/ bed rest/ wheelchair 0 Yes Weak (short  steps with or without shuffle, stooped but able to lift head while walking, may 0 No seek support from furniture) Impaired (short steps with shuffle, may have difficulty arising from chair, head down, impaired 0 No balance) Mental Status Oriented to own ability 0 Yes Electronic Signature(s) Signed: 01/14/2022 4:10:56 PM By: Angelina Pih Entered By: Angelina Pih on 01/14/2022 12:58:16 Lara, Pola Corn ([id number]) -------------------------------------------------------------------------------- Foot Assessment Details Patient Name: Rachel Dominguez. Date of Service: 01/14/2022 12:45 PM Medical Record Number: [id number] Patient Account Number: [id number] Date of Birth/Sex: 1945/09/02 (75 y.o. F) Treating RN: Angelina Pih Primary Care Ammie Warrick: Jerl Mina Other Clinician: Betha Loa Referring Toni Demo: Eusebio Friendly Treating Tarica Harl/Extender: Allen Derry Weeks in Treatment: 0 Foot Assessment Items Site Locations + = Sensation present, - = Sensation absent, C = Callus, U = Ulcer R = Redness, W = Warmth, M = Maceration, PU = Pre-ulcerative lesion F = Fissure, S = Swelling, D = Dryness Assessment Right: Left: Other Deformity: No No Prior Foot Ulcer: No No Prior Amputation: No No Charcot  Joint: No No Ambulatory Status: Ambulatory Without Help Gait: Steady Electronic Signature(s) Signed: 01/14/2022 4:10:56 PM By: Angelina Pih Entered By: Angelina Pih on 01/14/2022 13:00:47 Harkin, Pola Corn ([id number]) -------------------------------------------------------------------------------- Nutrition Risk Screening Details Patient Name: Rachel Dominguez. Date of Service: 01/14/2022 12:45 PM Medical Record Number: [id number] Patient Account Number: [id number] Date of Birth/Sex: October 05, 1945 (75 y.o. F) Treating RN: Angelina Pih Primary Care Derriana Oser: Jerl Mina Other Clinician: Betha Loa Referring Shabana Armentrout: Eusebio Friendly Treating Avelardo Reesman/Extender: Allen Derry Weeks in Treatment: 0 Height (in): 64 Weight (lbs): 120 Body Mass Index (BMI): 20.6 Nutrition Risk Screening Items Score Screening NUTRITION RISK SCREEN: I have an illness or condition that made me change the kind and/or amount of food I eat 0 No I eat fewer than two meals per day 0 No I eat few fruits and vegetables, or milk products 0 No I have three or more drinks of beer, liquor or wine almost every day 0 No I have tooth or mouth problems that make it hard for me to eat 0 No I don't always have enough money to buy the food I need 0 No I eat alone most of the time 0 No I take three or more different prescribed or over-the-counter drugs a day 0 No Without wanting to, I have lost or gained 10 pounds in the last six months 0 No I am not always physically able to shop, cook and/or feed myself 0 No Nutrition Protocols Good Risk Protocol 0 No interventions needed Moderate Risk Protocol High Risk Proctocol Risk Level: Good Risk Score: 0 Electronic Signature(s) Signed: 01/14/2022 4:10:56 PM By: Angelina Pih Entered By: Angelina Pih on 01/14/2022 12:58:25

## 2022-01-14 NOTE — Progress Notes (Signed)
ANAILY, ASHBAUGH ([id number]) Visit Report for 01/14/2022 Allergy List Details Patient Name: ALANE, HANSSEN. Date of Service: 01/14/2022 12:45 PM Medical Record Number: [id number] Patient Account Number: [id number] Date of Birth/Sex: 09-04-1945 (75 y.o. F) Treating RN: Angelina Pih Primary Care An Lannan: Jerl Mina Other Clinician: Betha Loa Referring Lakiesha Ralphs: Eusebio Friendly Treating Alla Sloma/Extender: Allen Derry Weeks in Treatment: 0 Allergies Active Allergies No Known Drug Allergies Allergy Notes Electronic Signature(s) Signed: 01/14/2022 4:10:56 PM By: Angelina Pih Entered By: Angelina Pih on 01/14/2022 12:55:16 [place], Pola Corn ([id number]) -------------------------------------------------------------------------------- Arrival Information Details Patient Name: Neal Dy. Date of Service: 01/14/2022 12:45 PM Medical Record Number: [id number] Patient Account Number: [id number] Date of Birth/Sex: 17-Oct-1945 (75 y.o. F) Treating RN: Angelina Pih Primary Care Maicy Filip: Jerl Mina Other Clinician: Betha Loa Referring Custer Pimenta: Eusebio Friendly Treating Layten Aiken/Extender: Rowan Blase in Treatment: 0 Visit Information Patient Arrived: Ambulatory Arrival Time: 12:49 Accompanied By: spouse Transfer Assistance: None Patient Identification Verified: Yes Secondary Verification Process Completed: Yes Electronic Signature(s) Signed: 01/14/2022 4:10:56 PM By: Angelina Pih Entered By: Angelina Pih on 01/14/2022 12:49:59 Lilley, Pola Corn ([id number]) -------------------------------------------------------------------------------- Clinic Level of Care Assessment Details Patient Name: Neal Dy. Date of Service: 01/14/2022 12:45 PM Medical Record Number: [id number] Patient Account Number: [id number] Date of Birth/Sex: 01/13/1946 (75 y.o. F) Treating RN: Angelina Pih Primary Care Alicen Donalson: Jerl Mina Other Clinician: Betha Loa Referring Jahaira Earnhart:  Eusebio Friendly Treating Parlee Amescua/Extender: Rowan Blase in Treatment: 0 Clinic Level of Care Assessment Items TOOL 2 Quantity Score []  - Use when only an EandM is performed on the INITIAL visit 0 ASSESSMENTS - Nursing Assessment / Reassessment X - General Physical Exam (combine w/ comprehensive assessment (listed just below) when performed on new 1 20 pt. evals) X- 1 25 Comprehensive Assessment (HX, ROS, Risk Assessments, Wounds Hx, etc.) ASSESSMENTS - Wound and Skin Assessment / Reassessment X - Simple Wound Assessment / Reassessment - one wound 1 5 []  - 0 Complex Wound Assessment / Reassessment - multiple wounds []  - 0 Dermatologic / Skin Assessment (not related to wound area) ASSESSMENTS - Ostomy and/or Continence Assessment and Care []  - Incontinence Assessment and Management 0 []  - 0 Ostomy Care Assessment and Management (repouching, etc.) PROCESS - Coordination of Care X - Simple Patient / Family Education for ongoing care 1 15 []  - 0 Complex (extensive) Patient / Family Education for ongoing care X- 1 10 Staff obtains , Records, Test Results / Process Orders []  - 0 Staff telephones HHA, Nursing Homes / Clarify orders / etc []  - 0 Routine Transfer to another Facility (non-emergent condition) []  - 0 Routine Hospital Admission (non-emergent condition) X- 1 15 New Admissions / / Ordering NPWT, Apligraf, etc. []  - 0 Emergency Hospital Admission (emergent condition) X- 1 10 Simple Discharge Coordination []  - 0 Complex (extensive) Discharge Coordination PROCESS - Special Needs []  - Pediatric / Minor Patient Management 0 []  - 0 Isolation Patient Management []  - 0 Hearing / Language / Visual special needs []  - 0 Assessment of Community assistance (transportation, D/C planning, etc.) []  - 0 Additional assistance / Altered mentation []  - 0 Support Surface(s) Assessment (bed, cushion, seat, etc.) INTERVENTIONS - Wound Cleansing /  Measurement X - Wound Imaging (photographs - any number of wounds) 1 5 []  - 0 Wound Tracing (instead of photographs) X- 1 5 Simple Wound Measurement - one wound []  - 0 Complex Wound Measurement - multiple wounds Dovidio, Jaleigh E. ( ) X- 1 5 Simple Wound Cleansing - one wound []  -  0 Complex Wound Cleansing - multiple wounds INTERVENTIONS - Wound Dressings X - Small Wound Dressing one or multiple wounds 1 10 []  - 0 Medium Wound Dressing one or multiple wounds []  - 0 Large Wound Dressing one or multiple wounds []  - 0 Application of Medications - injection INTERVENTIONS - Miscellaneous []  - External ear exam 0 []  - 0 Specimen Collection (cultures, biopsies, blood, body fluids, etc.) []  - 0 Specimen(s) / Culture(s) sent or taken to Lab for analysis []  - 0 Patient Transfer (multiple staff / Lift / Similar devices) []  - 0 Simple Staple / Suture removal (25 or less) []  - 0 Complex Staple / Suture removal (26 or more) []  - 0 Hypo / Hyperglycemic Management (close monitor of Blood Glucose) X- 1 15 Ankle / Brachial Index (ABI) - do not check if billed separately Has the patient been seen at the hospital within the last three years: Yes Total Score: 140 Level Of Care: New/Established - Level 4 Electronic Signature(s) Signed: 01/14/2022 4:10:56 PM By: Entered By: on 01/14/2022 13:40:06 Rhyne, ( ) -------------------------------------------------------------------------------- Encounter Discharge Information Details Patient Name: Michiel Sites. Date of Service: 01/14/2022 12:45 PM Medical Record Number: Patient Account Number: Date of Birth/Sex: November 13, 1945 (75 y.o. F) Treating RN: Angelina Pih Primary Care Celinda Dethlefs: 03/16/2022 Other Clinician: Pola Corn Referring Victorine Mcnee: [id number] Treating Verdis Bassette/Extender: Neal Dy in Treatment: 0 Encounter Discharge Information  Items Discharge Condition: Stable Ambulatory Status: Ambulatory Discharge Destination: Home Transportation: Private Auto Accompanied By: husband Schedule Follow-up Appointment: No Clinical Summary of Care: Electronic Signature(s) Signed: 01/14/2022 1:41:22 PM By: [id number] Entered By: [id number] on 01/14/2022 13:41:22 [place], 05-18-1975 (Angelina Pih) -------------------------------------------------------------------------------- Lower Extremity Assessment Details Patient Name: Jerl Mina. Date of Service: 01/14/2022 12:45 PM Medical Record Number: Eusebio Friendly Patient Account Number: Rowan Blase Date of Birth/Sex: [birth year] (75 y.o. F) Treating RN: Angelina Pih Primary Care Raeleen Winstanley: 03/16/2022 Other Clinician: Pola Corn Referring Kaitlynne Wenz: [id number] Treating Rashon Westrup/Extender: Neal Dy Weeks in Treatment: 0 Edema Assessment Assessed: [Left: No] [Right: No] Edema: [Left: N] [Right: o] Calf Left: Right: Point of Measurement: 33 cm From Medial Instep 34 cm Ankle Left: Right: Point of Measurement: 11 cm From Medial Instep 19.5 cm Knee To Floor Left: Right: From Medial Instep 39 cm Vascular Assessment Pulses: Dorsalis Pedis Palpable: [Left:Yes] Blood Pressure: Brachial: [Left:140] Dorsalis Pedis: 115 Ankle: Posterior Tibial: 140 Ankle Brachial Index: [Left:1.00] Electronic Signature(s) Signed: 01/14/2022 4:10:56 PM By: [id number] Entered By: [id number] on 01/14/2022 13:03:51 [place], 05-18-1975 (Angelina Pih) -------------------------------------------------------------------------------- Multi Wound Chart Details Patient Name: Jerl Mina. Date of Service: 01/14/2022 12:45 PM Medical Record Number: Eusebio Friendly Patient Account Number: Allen Derry Date of Birth/Sex: Nov 19, 1945 (75 y.o. F) Treating RN: Angelina Pih Primary Care Latanya Hemmer: 03/16/2022 Other Clinician: Pola Corn Referring Torryn Fiske: [id number] Treating Donnavan Covault/Extender:  Neal Dy in Treatment: 0 Vital Signs Height(in): 64 Pulse(bpm): 60 Weight(lbs): 120 Blood Pressure(mmHg): 140/77 Body Mass Index(BMI): 20.6 Temperature(F): 98.2 Respiratory Rate(breaths/min): 18 Photos: [N/A:N/A] Wound Location: Left, Midline Ankle N/A N/A Wounding Event: Trauma N/A N/A Primary Etiology: Trauma, Other N/A N/A Comorbid History: Dementia N/A N/A Date Acquired: 12/11/2021 N/A N/A Weeks of Treatment: 0 N/A N/A Wound Status: Open N/A N/A Wound Recurrence: No N/A N/A Measurements L x W x D (cm) 1.5x1.7x0.1 N/A N/A Area (cm) : 2.003 N/A N/A Volume (cm) : 0.2 N/A N/A Classification: Full Thickness Without Exposed N/A N/A Support Structures Exudate Amount: Medium N/A N/A Exudate Type:  Serosanguineous N/A N/A Exudate Color: red, brown N/A N/A Granulation Amount: None Present (0%) N/A N/A Necrotic Amount: Large (67-100%) N/A N/A Necrotic Tissue: Eschar N/A N/A Epithelialization: None N/A N/A Treatment Notes Electronic Signature(s) Signed: 01/14/2022 1:10:26 PM By: Angelina Pih Entered By: Angelina Pih on 01/14/2022 13:10:26 Centner, Pola Corn ([id number]) -------------------------------------------------------------------------------- Multi-Disciplinary Care Plan Details Patient Name: Neal Dy. Date of Service: 01/14/2022 12:45 PM Medical Record Number: [id number] Patient Account Number: [id number] Date of Birth/Sex: January 16, 1946 (75 y.o. F) Treating RN: Angelina Pih Primary Care Nathanie Ottley: Jerl Mina Other Clinician: Betha Loa Referring Katherina Wimer: Eusebio Friendly Treating Vikram Tillett/Extender: Allen Derry Weeks in Treatment: 0 Active Inactive Electronic Signature(s) Signed: 01/14/2022 1:40:50 PM By: Angelina Pih Previous Signature: 01/14/2022 1:09:32 PM Version By: Angelina Pih Entered By: Angelina Pih on 01/14/2022 13:40:50 Linam, Pola Corn ([id number]) -------------------------------------------------------------------------------- Pain  Assessment Details Patient Name: Neal Dy. Date of Service: 01/14/2022 12:45 PM Medical Record Number: [id number] Patient Account Number: [id number] Date of Birth/Sex: 04/28/1946 (75 y.o. F) Treating RN: Angelina Pih Primary Care Arye Weyenberg: Jerl Mina Other Clinician: Betha Loa Referring Aino Heckert: Eusebio Friendly Treating Cleatis Fandrich/Extender: Rowan Blase in Treatment: 0 Active Problems Location of Pain Severity and Description of Pain Patient Has Paino No Site Locations Rate the pain. Current Pain Level: 0 Pain Management and Medication Current Pain Management: Electronic Signature(s) Signed: 01/14/2022 4:10:56 PM By: Angelina Pih Entered By: Angelina Pih on 01/14/2022 12:50:06 Achorn, Pola Corn ([id number]) -------------------------------------------------------------------------------- Patient/Caregiver Education Details Patient Name: Neal Dy. Date of Service: 01/14/2022 12:45 PM Medical Record Number: [id number] Patient Account Number: [id number] Date of Birth/Gender: 1946/04/04 (75 y.o. F) Treating RN: Angelina Pih Primary Care Physician: Jerl Mina Other Clinician: Betha Loa Referring Physician: Eusebio Friendly Treating Physician/Extender: Rowan Blase in Treatment: 0 Education Assessment Education Provided To: Patient and Caregiver Education Topics Provided [facility]: Handouts: [facility] Methods: Explain/Verbal Responses: State content correctly Wound/Skin Impairment: Handouts: Caring for Your Ulcer Methods: Explain/Verbal Responses: State content correctly Electronic Signature(s) Signed: 01/14/2022 4:10:56 PM By: Angelina Pih Entered By: Angelina Pih on 01/14/2022 13:40:31 Chain, Pola Corn ([id number]) -------------------------------------------------------------------------------- Wound Assessment Details Patient Name: Neal Dy. Date of Service: 01/14/2022 12:45 PM Medical  Record Number: [id number] Patient Account Number: [id number] Date of Birth/Sex: 1945-07-27 (75 y.o. F) Treating RN: Angelina Pih Primary Care Bastion Bolger: Jerl Mina Other Clinician: Betha Loa Referring Salem Lembke: Eusebio Friendly Treating Shameeka Silliman/Extender: Allen Derry Weeks in Treatment: 0 Wound Status Wound Number: 1 Primary Etiology: Trauma, Other Wound Location: Left, Midline Ankle Wound Status: Open Wounding Event: Trauma Comorbid History: Dementia Date Acquired: 12/11/2021 Weeks Of Treatment: 0 Clustered Wound: No Photos Wound Measurements Length: (cm) 1.5 Width: (cm) 1.7 Depth: (cm) 0.1 Area: (cm) 2.003 Volume: (cm) 0.2 % Reduction in Area: % Reduction in Volume: Epithelialization: None Tunneling: No Undermining: No Wound Description Classification: Full Thickness Without Exposed Support Structu Exudate Amount: Medium Exudate Type: Serosanguineous Exudate Color: red, brown res Foul Odor After Cleansing: No Slough/Fibrino Yes Wound Bed Granulation Amount: None Present (0%) Necrotic Amount: Large (67-100%) Necrotic Quality: Eschar Electronic Signature(s) Signed: 01/14/2022 4:10:56 PM By: Angelina Pih Entered By: Angelina Pih on 01/14/2022 13:06:50 Koerber, Pola Corn ([id number]) -------------------------------------------------------------------------------- Vitals Details Patient Name: Neal Dy. Date of Service: 01/14/2022 12:45 PM Medical Record Number: [id number] Patient Account Number: [id number] Date of Birth/Sex: 09-Dec-1945 (75 y.o. F) Treating RN: Angelina Pih Primary Care Gatlin Kittell: Jerl Mina Other Clinician: Betha Loa Referring Amaurie Schreckengost: Eusebio Friendly Treating Parthena Fergeson/Extender: Allen Derry Weeks in Treatment: 0  Vital Signs Time Taken: 12:50 Temperature (F): 98.2 Height (in): 64 Pulse (bpm): 60 Source: Stated Respiratory Rate (breaths/min): 18 Weight (lbs): 120 Blood Pressure (mmHg): 140/77 Source: Stated Reference  Range: 80 - 120 mg / dl Body Mass Index (BMI): 20.6 Electronic Signature(s) Signed: 01/14/2022 4:10:56 PM By: Angelina Pih Entered By: Angelina Pih on 01/14/2022 12:53:15
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
## 2022-02-14 IMAGING — MG MM DIGITAL SCREENING BILAT W/ TOMO AND CAD
6 of 12 series · 6 of 36 positions shown · non-contrast
Comparison: Previous exam(s).

CLINICAL DATA: Screening.

EXAM:
DIGITAL SCREENING BILATERAL MAMMOGRAM WITH TOMOSYNTHESIS AND CAD
TECHNIQUE: Bilateral screening digital craniocaudal and mediolateral oblique
mammograms were obtained. Bilateral screening digital breast
tomosynthesis was performed. The images were evaluated with
computer-aided detection.

[L MLO synth-2D]
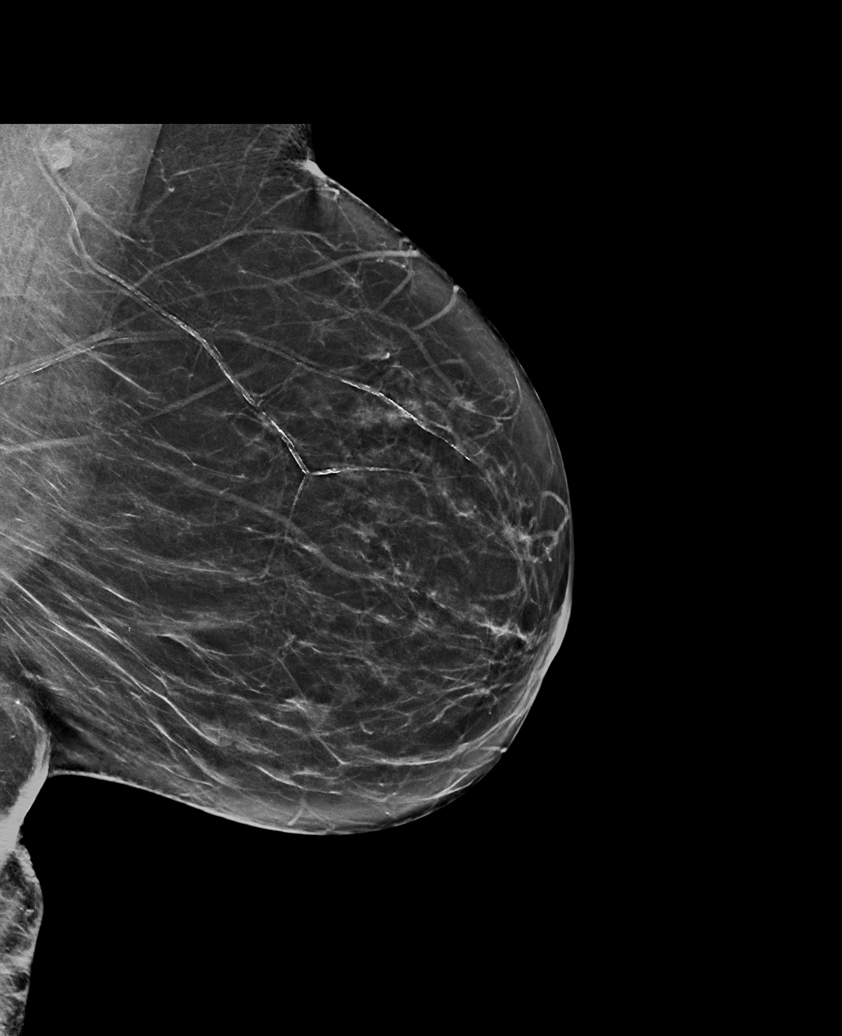

[L XCCL synth-2D]
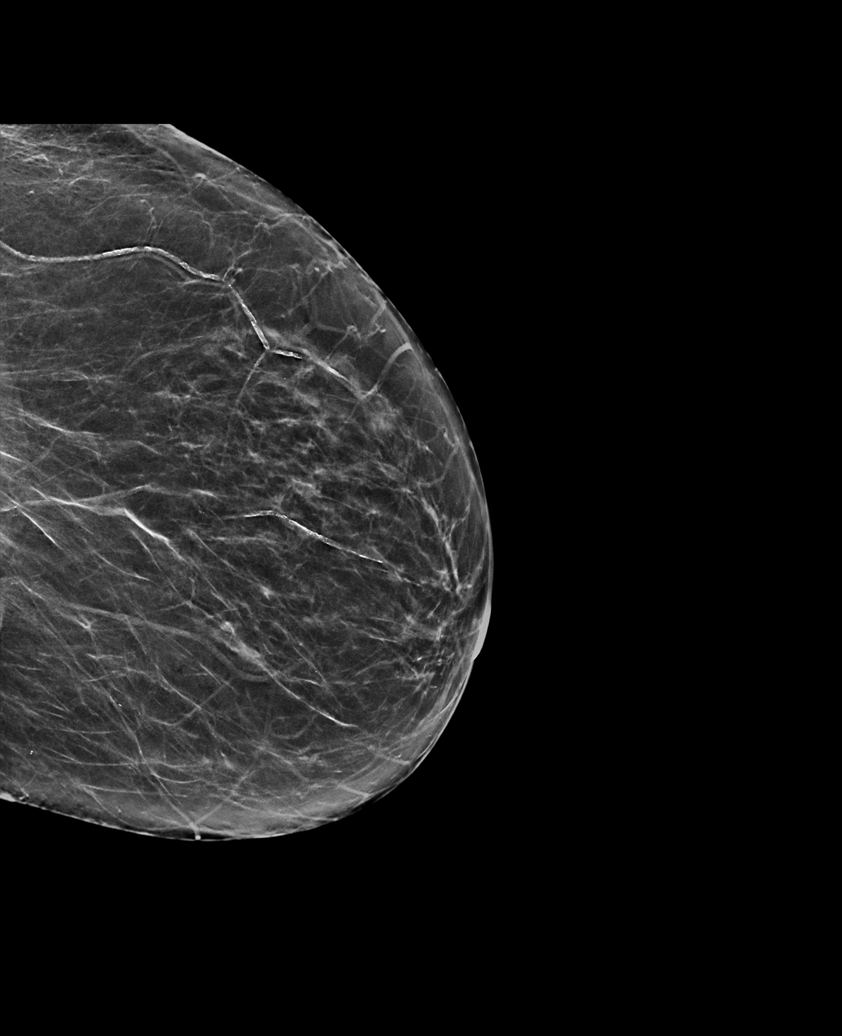

[R MLO synth-2D]
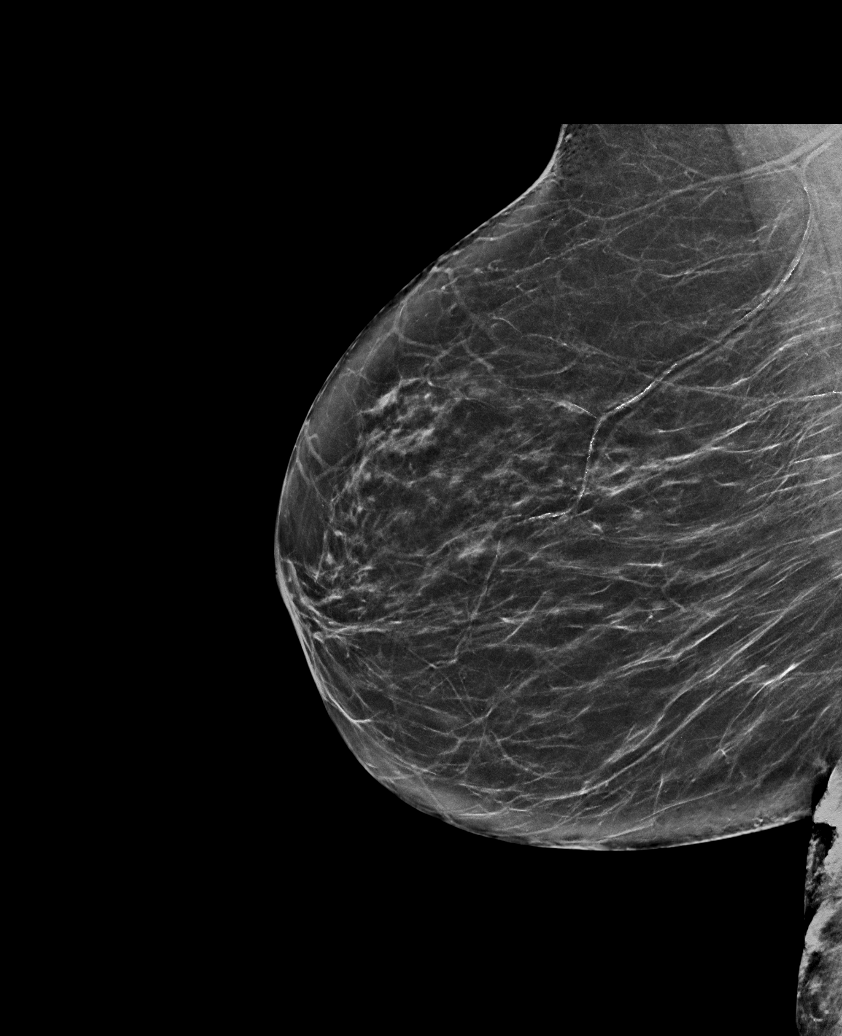

[R CC synth-2D (1 of 2)]
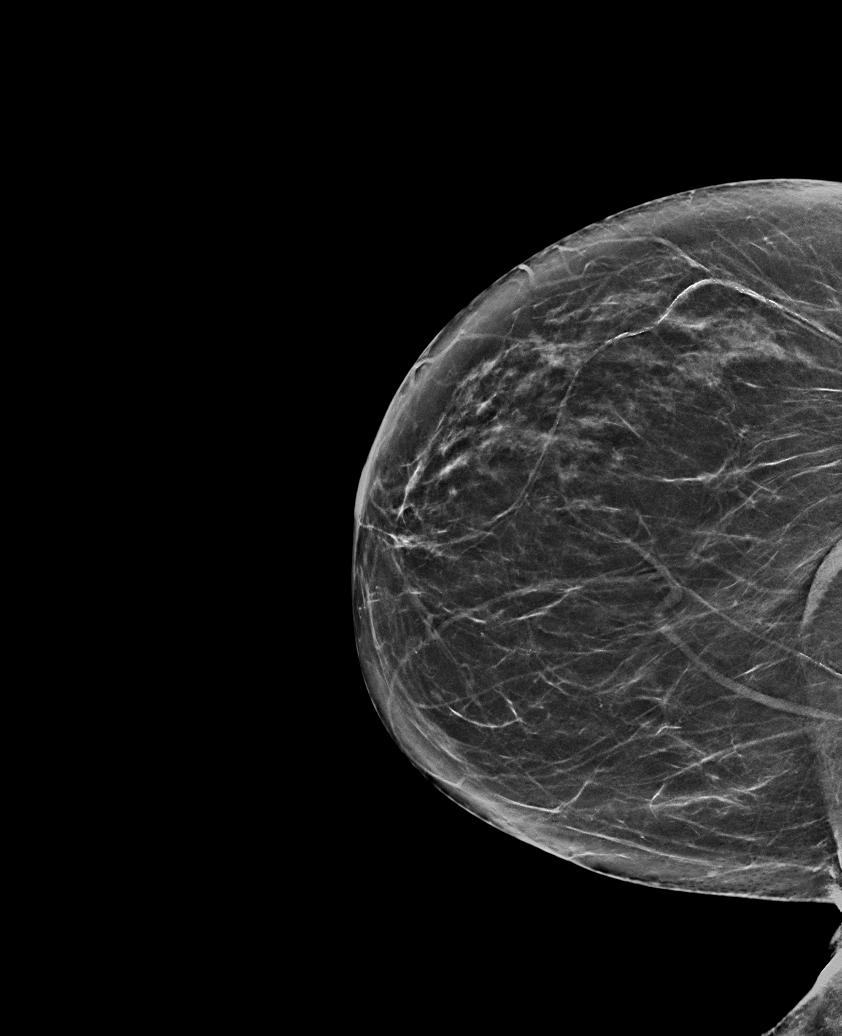

[R CC synth-2D (2 of 2)]
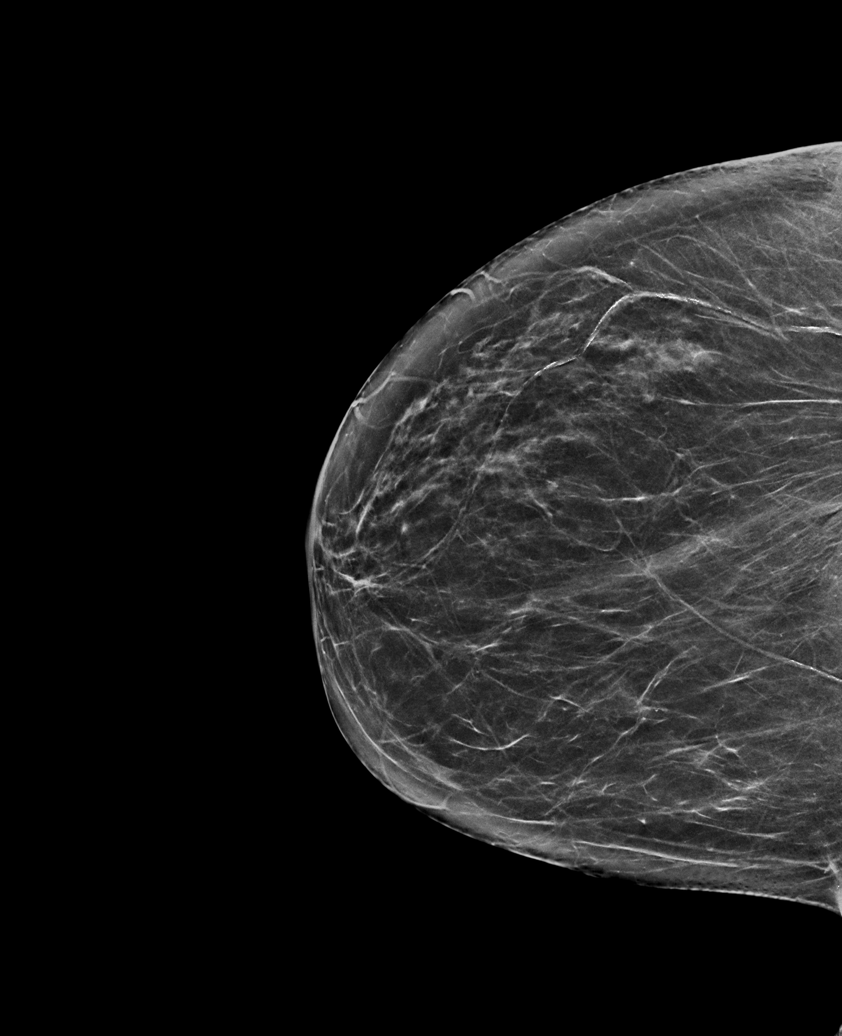

[L CC synth-2D]
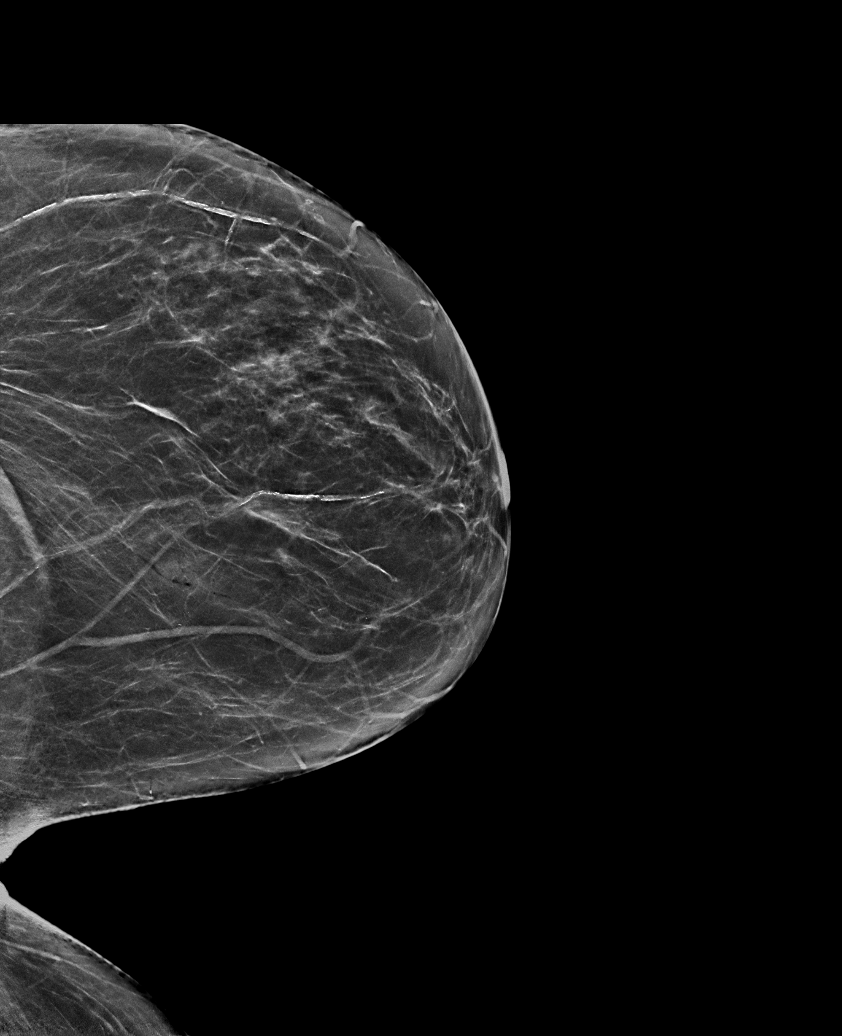

[6 of 36 positions shown; findings below may reference images not displayed]

ACR Breast Density Category b: There are scattered areas of
fibroglandular density.
FINDINGS: There are no findings suspicious for malignancy. The images were
evaluated with computer-aided detection.
IMPRESSION: No mammographic evidence of malignancy. A result letter of this
screening mammogram will be mailed directly to the patient.

RECOMMENDATION:
Screening mammogram in one year. (Code:[id number])

BI-RADS CATEGORY  1: Negative.

## 2023-02-21 ENCOUNTER — Encounter: Payer: Self-pay | Admitting: Emergency Medicine

## 2023-02-21 ENCOUNTER — Ambulatory Visit
Admission: EM | Admit: 2023-02-21 | Discharge: 2023-02-21 | Disposition: A | Discharge status: Home / Self Care | Payer: Medicare HMO | Attending: Emergency Medicine | Admitting: Emergency Medicine

## 2023-02-21 DIAGNOSIS — B9689 Other specified bacterial agents as the cause of diseases classified elsewhere: Secondary | ICD-10-CM | POA: Diagnosis not present

## 2023-02-21 DIAGNOSIS — H109 Unspecified conjunctivitis: Secondary | ICD-10-CM | POA: Diagnosis not present

## 2023-02-21 DIAGNOSIS — R051 Acute cough: Secondary | ICD-10-CM | POA: Diagnosis not present

## 2023-02-21 MED ORDER — CIPROFLOXACIN HCL 0.3 % OP SOLN: 1.0000 [drp] | Freq: Two times a day (BID) | OPHTHALMIC | 0 refills | Status: AC

## 2023-02-21 MED ORDER — GUAIFENESIN 100 MG/5ML PO LIQD: 100.0000 mg | ORAL | 0 refills | Status: AC | PRN

## 2023-02-21 MED ORDER — ERYTHROMYCIN 5 MG/GM OP OINT: TOPICAL_OINTMENT | OPHTHALMIC | 0 refills | Status: AC

## 2023-02-21 NOTE — ED Provider Notes (Signed)
[facility]    CSN: [id number] Arrival date & time: 02/21/23  1346      History   Chief Complaint Chief Complaint  Patient presents with   Cough   Eye Irritation     HPI Rachel Dominguez is a 77 y.o. female.   Patient presents today with bilateral redness of eye drainage itchy and slight sore throat for the past week.  Patient's husband has a similar symptoms.  Does have a slight cough intermittently.  Denies any nausea vomiting is no diarrhea no chest pain no shortness of breath.  Has only been using a warm washcloth with no relief.  Denies any known fevers    Past Medical History:  Diagnosis Date   Dementia (HCC)     There are no problems to display for this patient.   History reviewed. No pertinent surgical history.  OB History   No obstetric history on file.      Home Medications    Prior to Admission medications   Medication Sig Start Date End Date Taking? Authorizing Provider  ciprofloxacin (CILOXAN) 0.3 % ophthalmic solution Place 1 drop into both eyes every 12 (twelve) hours. Administer 1 drop, every 2 hours, while awake, for 2 days. Then 1 drop, every 4 hours, while awake, for the next 5 days. 02/21/23  Yes Coralyn Mark, NP  erythromycin ophthalmic ointment Place a 1/2 inch ribbon of ointment into the lower eyelid. 02/21/23  Yes Kaleesi Guyton L, NP  donepezil (ARICEPT) 10 MG tablet TAKE 1 TABLET BY MOUTH EVERY DAY AT NIGHT 03/02/21   [provider]  meloxicam (MOBIC) 7.5 MG tablet Take 7.5 mg by mouth daily as needed. 10/15/21   [provider]  traZODone (DESYREL) 150 MG tablet Take by mouth. 10/07/21   [provider]    Family History Family History  Problem Relation Age of Onset   Breast cancer Neg Hx     Social History Social History   Tobacco Use   Smoking status: Former    Types: Cigarettes   Smokeless tobacco: Former  Building services engineer status: Never Used  Substance Use Topics   Alcohol use: Not  Currently   Drug use: Never     Allergies   Patient has no known allergies.   Review of Systems Review of Systems  Constitutional:  Negative for chills and fever.  HENT: Negative.    Eyes:  Positive for discharge, redness and itching. Negative for photophobia, pain and visual disturbance.       Burning to bilateral eyes  Respiratory:  Positive for cough. Negative for shortness of breath.   Cardiovascular: Negative.   Gastrointestinal: Negative.   Skin: Negative.   Neurological: Negative.      Physical Exam Triage Vital Signs ED Triage Vitals [02/21/23 1421]  Encounter Vitals Group     BP      Systolic BP Percentile      Diastolic BP Percentile      Pulse Rate 74     Resp 16     Temp 98.4 F (36.9 C)     Temp Source Oral     SpO2 100 %     Weight      Height      Head Circumference      Peak Flow      Pain Score 0     Pain Loc      Pain Education      Exclude from Growth Chart  No data found.  Updated Vital Signs Pulse 74   Temp 98.4 F (36.9 C) (Oral)   Resp 16   SpO2 100%   Visual Acuity Right Eye Distance:   Left Eye Distance:   Bilateral Distance:    Right Eye Near:   Left Eye Near:    Bilateral Near:     Physical Exam HENT:     Nose: Congestion present.     Mouth/Throat:     Mouth: Mucous membranes are moist.  Eyes:     General:        Right eye: Discharge present.        Left eye: Discharge present.    Comments: Bilateral erythema and conjunctivitis with slight yellow discharge.  Small amount of edema edema noted to bilateral eyes and surrounding.  Denies any visual changes  Cardiovascular:     Rate and Rhythm: Normal rate.  Musculoskeletal:     Cervical back: Normal range of motion.  Neurological:     Mental Status: She is alert.      UC Treatments / Results  Labs (all labs ordered are listed, but only abnormal results are displayed) Labs Reviewed - No data to display  EKG   Radiology No results  found.  Procedures Procedures (including critical care time)  Medications Ordered in UC Medications - No data to display  Initial Impression / Assessment and Plan / UC Course  I have reviewed the triage vital signs and the nursing notes.  Pertinent labs & imaging results that were available during my care of the patient were reviewed by me and considered in my medical decision making (see chart for details).     Apply warm washcloth several times a day to bilateral eyes Use ointment and drops initially twice daily for the first 3 days then once a day at night. Wash hands well after use applied to bilateral eyes If any visual changes or symptoms become worse you will need to be seen in the emergency room  Final Clinical Impressions(s) / UC Diagnoses   Final diagnoses:  Bacterial conjunctivitis of both eyes  Acute cough     Discharge Instructions      Apply warm washcloth several times a day to bilateral eyes Use ointment and drops initially twice daily for the first 3 days then once a day at night. Wash hands well after use applied to bilateral eyes If any visual changes or symptoms become worse you will need to be seen in the emergency room     ED Prescriptions     Medication Sig Dispense Auth. Provider   erythromycin ophthalmic ointment Place a 1/2 inch ribbon of ointment into the lower eyelid. 3.5 g Maple Mirza L, NP   ciprofloxacin (CILOXAN) 0.3 % ophthalmic solution Place 1 drop into both eyes every 12 (twelve) hours. Administer 1 drop, every 2 hours, while awake, for 2 days. Then 1 drop, every 4 hours, while awake, for the next 5 days. 10 mL Coralyn Mark, NP      PDMP not reviewed this encounter.   Coralyn Mark, NP 02/21/23 1447

## 2023-02-21 NOTE — Discharge Instructions (Signed)
Apply warm washcloth several times a day to bilateral eyes Use ointment and drops initially twice daily for the first 3 days then once a day at night. Wash hands well after use applied to bilateral eyes If any visual changes or symptoms become worse you will need to be seen in the emergency room

## 2023-02-21 NOTE — ED Triage Notes (Signed)
Pt presents with a non-productive cough and bilateral eye redness x 1 week.

## 2023-09-17 ENCOUNTER — Ambulatory Visit
Admission: RE | Admit: 2023-09-17 | Discharge: 2023-09-17 | Disposition: A | Discharge status: Home / Self Care | Payer: Self-pay | Source: Ambulatory Visit | Attending: Family Medicine

## 2023-09-17 VITALS — BP 144/76 | HR 66 | Temp 98.3°F | Resp 16

## 2023-09-17 DIAGNOSIS — H6122 Impacted cerumen, left ear: Secondary | ICD-10-CM

## 2023-09-17 NOTE — ED Triage Notes (Signed)
 Left ear pain Feels like it is plugged

## 2023-09-17 NOTE — Discharge Instructions (Signed)
 Avoid Q-tips or earbuds.  Follow-up with your PCP as needed.

## 2023-09-17 NOTE — ED Provider Notes (Signed)
 [facility]    CSN: [id number] Arrival date & time: 09/17/23  1358      History   Chief Complaint Chief Complaint  Patient presents with   Ear Fullness    Entered by patient    HPI Rachel Dominguez is a 78 y.o. female presents for left ear fullness.  Patient reports a couple days of left ear fullness/plugged sensation without pain, drainage.  Does endorse some muffled hearing.  No fevers, cough cold symptoms.  Denies using Q-tips, wearing earbuds, or hearing aids.  Has not used any OTC medications for symptoms.  No other concerns at this time.   Ear Fullness    Past Medical History:  Diagnosis Date   Dementia (HCC)     There are no active problems to display for this patient.   No past surgical history on file.  OB History   No obstetric history on file.      Home Medications    Prior to Admission medications   Medication Sig Start Date End Date Taking? Authorizing Provider  donepezil (ARICEPT) 10 MG tablet TAKE 1 TABLET BY MOUTH EVERY DAY AT NIGHT 03/02/21  Yes [provider]  meloxicam (MOBIC) 7.5 MG tablet Take 7.5 mg by mouth daily as needed. 10/15/21  Yes [provider]  ciprofloxacin (CILOXAN) 0.3 % ophthalmic solution Place 1 drop into both eyes every 12 (twelve) hours. Administer 1 drop, every 2 hours, while awake, for 2 days. Then 1 drop, every 4 hours, while awake, for the next 5 days. 02/21/23   Coralyn Mark, NP  erythromycin ophthalmic ointment Place a 1/2 inch ribbon of ointment into the lower eyelid. 02/21/23   Coralyn Mark, NP  guaiFENesin (ROBITUSSIN) 100 MG/5ML liquid Take 5-10 mLs (100-200 mg total) by mouth every 4 (four) hours as needed for cough or to loosen phlegm. 02/21/23   Coralyn Mark, NP  traZODone (DESYREL) 150 MG tablet Take by mouth. 10/07/21   [provider]    Family History Family History  Problem Relation Age of Onset   Breast cancer Neg Hx     Social History Social History    Tobacco Use   Smoking status: Former    Types: Cigarettes   Smokeless tobacco: Former  Building services engineer status: Never Used  Substance Use Topics   Alcohol use: Not Currently   Drug use: Never     Allergies   Patient has no known allergies.   Review of Systems Review of Systems  HENT:         Left plugged ear     Physical Exam Triage Vital Signs ED Triage Vitals [09/17/23 1404]  Encounter Vitals Group     BP      Systolic BP Percentile      Diastolic BP Percentile      Pulse      Resp      Temp      Temp src      SpO2      Weight      Height      Head Circumference      Peak Flow      Pain Score 0     Pain Loc      Pain Education      Exclude from Growth Chart    No data found.  Updated Vital Signs BP (!) 144/76 (BP Location: Right Arm)   Pulse 66   Temp 98.3 F (36.8 C) (  Oral)   Resp 16   SpO2 97%   Visual Acuity Right Eye Distance:   Left Eye Distance:   Bilateral Distance:    Right Eye Near:   Left Eye Near:    Bilateral Near:     Physical Exam Vitals and nursing note reviewed.  Constitutional:      General: She is not in acute distress.    Appearance: Normal appearance. She is not ill-appearing.  HENT:     Head: Normocephalic and atraumatic.     Right Ear: Tympanic membrane and ear canal normal.     Left Ear: There is impacted cerumen.  Eyes:     Pupils: Pupils are equal, round, and reactive to light.  Cardiovascular:     Rate and Rhythm: Normal rate.  Pulmonary:     Effort: Pulmonary effort is normal.  Skin:    General: Skin is warm and dry.  Neurological:     General: No focal deficit present.     Mental Status: She is alert and oriented to person, place, and time.  Psychiatric:        Mood and Affect: Mood normal.        Behavior: Behavior normal.      UC Treatments / Results  Labs (all labs ordered are listed, but only abnormal results are displayed) Labs Reviewed - No data to display  EKG   Radiology No  results found.  Procedures Procedures (including critical care time)  Medications Ordered in UC Medications - No data to display  Initial Impression / Assessment and Plan / UC Course  I have reviewed the triage vital signs and the nursing notes.  Pertinent labs & imaging results that were available during my care of the patient were reviewed by me and considered in my medical decision making (see chart for details).     Reviewed exam and symptoms with patient.  No red flags. After irrigation TM visible and WNL, canal remains WNL. Advised avoidence of ear buds or q-tips. Follow up with PCP or ENT as needed Final Clinical Impressions(s) / UC Diagnoses   Final diagnoses:  Impacted cerumen of left ear     Discharge Instructions      Avoid Q-tips or earbuds.  Follow-up with your PCP as needed.     ED Prescriptions   None    PDMP not reviewed this encounter.   Radford Pax, NP 09/17/23 1444

## 2023-10-10 DIAGNOSIS — E871 Hypo-osmolality and hyponatremia: Secondary | ICD-10-CM | POA: Diagnosis not present

## 2023-10-10 DIAGNOSIS — F0150 Vascular dementia without behavioral disturbance: Secondary | ICD-10-CM | POA: Diagnosis not present

## 2023-10-10 DIAGNOSIS — R634 Abnormal weight loss: Secondary | ICD-10-CM | POA: Diagnosis not present

## 2023-10-10 DIAGNOSIS — R197 Diarrhea, unspecified: Secondary | ICD-10-CM | POA: Diagnosis not present

## 2023-10-10 DIAGNOSIS — F0280 Dementia in other diseases classified elsewhere without behavioral disturbance: Secondary | ICD-10-CM | POA: Diagnosis not present

## 2023-10-10 DIAGNOSIS — R110 Nausea: Secondary | ICD-10-CM | POA: Diagnosis not present

## 2023-10-10 DIAGNOSIS — G309 Alzheimer's disease, unspecified: Secondary | ICD-10-CM | POA: Diagnosis not present

## 2023-10-10 DIAGNOSIS — R1110 Vomiting, unspecified: Secondary | ICD-10-CM | POA: Diagnosis not present

## 2023-10-11 DIAGNOSIS — K529 Noninfective gastroenteritis and colitis, unspecified: Secondary | ICD-10-CM | POA: Diagnosis not present

## 2023-10-11 DIAGNOSIS — R634 Abnormal weight loss: Secondary | ICD-10-CM | POA: Diagnosis not present

## 2023-10-24 DIAGNOSIS — R634 Abnormal weight loss: Secondary | ICD-10-CM | POA: Diagnosis not present

## 2023-10-24 DIAGNOSIS — K529 Noninfective gastroenteritis and colitis, unspecified: Secondary | ICD-10-CM | POA: Diagnosis not present

## 2023-10-24 DIAGNOSIS — K5989 Other specified functional intestinal disorders: Secondary | ICD-10-CM | POA: Diagnosis not present

## 2023-12-14 DIAGNOSIS — R110 Nausea: Secondary | ICD-10-CM | POA: Diagnosis not present

## 2023-12-14 DIAGNOSIS — G309 Alzheimer's disease, unspecified: Secondary | ICD-10-CM | POA: Diagnosis not present

## 2023-12-14 DIAGNOSIS — F0150 Vascular dementia without behavioral disturbance: Secondary | ICD-10-CM | POA: Diagnosis not present

## 2023-12-14 DIAGNOSIS — R197 Diarrhea, unspecified: Secondary | ICD-10-CM | POA: Diagnosis not present

## 2023-12-14 DIAGNOSIS — E871 Hypo-osmolality and hyponatremia: Secondary | ICD-10-CM | POA: Diagnosis not present

## 2023-12-14 DIAGNOSIS — R634 Abnormal weight loss: Secondary | ICD-10-CM | POA: Diagnosis not present

## 2023-12-14 DIAGNOSIS — F0280 Dementia in other diseases classified elsewhere without behavioral disturbance: Secondary | ICD-10-CM | POA: Diagnosis not present

## 2023-12-14 DIAGNOSIS — E559 Vitamin D deficiency, unspecified: Secondary | ICD-10-CM | POA: Diagnosis not present
